# Patient Record
Sex: Female | Born: 1980 | Hispanic: No | Marital: Single | Smoking: Never smoker
Health system: Southern US, Community
[De-identification: ages and names within clinical notes are randomized; demographics above are authoritative.]

## PROBLEM LIST (undated history)

## (undated) HISTORY — PX: OTHER SURGICAL HISTORY: SHX169

## (undated) HISTORY — PX: CHOLECYSTECTOMY: SHX55

---

## 2002-07-02 ENCOUNTER — Encounter: Payer: Self-pay | Admitting: Emergency Medicine

## 2002-07-02 ENCOUNTER — Inpatient Hospital Stay (HOSPITAL_COMMUNITY): Admission: AC | Admit: 2002-07-02 | Discharge: 2002-07-05 | Payer: Self-pay

## 2012-07-11 ENCOUNTER — Emergency Department (HOSPITAL_COMMUNITY): Payer: Self-pay

## 2012-07-11 ENCOUNTER — Encounter (HOSPITAL_COMMUNITY): Payer: Self-pay | Admitting: Emergency Medicine

## 2012-07-11 ENCOUNTER — Emergency Department (HOSPITAL_COMMUNITY)
Admission: EM | Admit: 2012-07-11 | Discharge: 2012-07-11 | Disposition: A | Discharge status: Home / Self Care | Payer: Self-pay | Attending: Emergency Medicine | Admitting: Emergency Medicine

## 2012-07-11 DIAGNOSIS — N200 Calculus of kidney: Secondary | ICD-10-CM | POA: Insufficient documentation

## 2012-07-11 DIAGNOSIS — Z3202 Encounter for pregnancy test, result negative: Secondary | ICD-10-CM | POA: Insufficient documentation

## 2012-07-11 DIAGNOSIS — N390 Urinary tract infection, site not specified: Secondary | ICD-10-CM | POA: Insufficient documentation

## 2012-07-11 DIAGNOSIS — R110 Nausea: Secondary | ICD-10-CM | POA: Insufficient documentation

## 2012-07-11 DIAGNOSIS — N926 Irregular menstruation, unspecified: Secondary | ICD-10-CM | POA: Insufficient documentation

## 2012-07-11 DIAGNOSIS — Z8719 Personal history of other diseases of the digestive system: Secondary | ICD-10-CM | POA: Insufficient documentation

## 2012-07-11 LAB — COMPREHENSIVE METABOLIC PANEL
Alkaline Phosphatase: 78 U/L (ref 39–117)
BUN: 10 mg/dL (ref 6–23)
CO2: 27 mEq/L (ref 19–32)
Chloride: 102 mEq/L (ref 96–112)
Creatinine, Ser: 0.66 mg/dL (ref 0.50–1.10)
GFR calc Af Amer: >90 mL/min (ref >90)
GFR calc non Af Amer: >90 mL/min (ref >90)
Glucose, Bld: 110 mg/dL — ABNORMAL HIGH (ref 70–99)
Potassium: 3.8 mEq/L (ref 3.5–5.1)
Total Bilirubin: 0.4 mg/dL (ref 0.3–1.2)

## 2012-07-11 LAB — URINE MICROSCOPIC-ADD ON

## 2012-07-11 LAB — CBC WITH DIFFERENTIAL/PLATELET
Basophils Relative: 0 % (ref 0–1)
HCT: 41.2 % (ref 36.0–46.0)
Hemoglobin: 13.6 g/dL (ref 12.0–15.0)
Lymphs Abs: 2 10*3/uL (ref 0.7–4.0)
MCH: 28 pg (ref 26.0–34.0)
MCHC: 33 g/dL (ref 30.0–36.0)
Monocytes Absolute: 0.6 10*3/uL (ref 0.1–1.0)
Monocytes Relative: 5 % (ref 3–12)
Neutro Abs: 9.2 10*3/uL — ABNORMAL HIGH (ref 1.7–7.7)
Neutrophils Relative %: 78 % — ABNORMAL HIGH (ref 43–77)
RBC: 4.85 MIL/uL (ref 3.87–5.11)

## 2012-07-11 LAB — URINALYSIS, ROUTINE W REFLEX MICROSCOPIC
Bilirubin Urine: NEGATIVE
Ketones, ur: NEGATIVE mg/dL
Nitrite: NEGATIVE
Protein, ur: NEGATIVE mg/dL
Specific Gravity, Urine: 1.021 (ref 1.005–1.030)
Urobilinogen, UA: 0.2 mg/dL (ref 0.0–1.0)

## 2012-07-11 LAB — PREGNANCY, URINE: Preg Test, Ur: NEGATIVE

## 2012-07-11 LAB — LIPASE, BLOOD: Lipase: 15 U/L (ref 11–59)

## 2012-07-11 MED ORDER — SODIUM CHLORIDE 0.9 % IV BOLUS (SEPSIS)
1000.0000 mL | Freq: Once | INTRAVENOUS | Status: AC
  Administered 2012-07-11: 1000 mL via INTRAVENOUS

## 2012-07-11 MED ORDER — CEPHALEXIN 500 MG PO CAPS
500.0000 mg | ORAL_CAPSULE | Freq: Two times a day (BID) | ORAL | Status: DC
Start: 2012-07-11 — End: 2016-02-03

## 2012-07-11 MED ORDER — MORPHINE SULFATE 4 MG/ML IJ SOLN
4.0000 mg | Freq: Once | INTRAMUSCULAR | Status: AC
  Administered 2012-07-11: 4 mg via INTRAVENOUS
  Filled 2012-07-11: qty 1

## 2012-07-11 MED ORDER — ONDANSETRON HCL 4 MG/2ML IJ SOLN
4.0000 mg | Freq: Once | INTRAMUSCULAR | Status: AC
  Administered 2012-07-11: 4 mg via INTRAVENOUS
  Filled 2012-07-11: qty 2

## 2012-07-11 MED ORDER — IOHEXOL 300 MG/ML  SOLN
100.0000 mL | Freq: Once | INTRAMUSCULAR | Status: AC | PRN
  Administered 2012-07-11: 100 mL via INTRAVENOUS

## 2012-07-11 MED ORDER — DEXTROSE 5 % IV SOLN
1.0000 g | Freq: Once | INTRAVENOUS | Status: AC
  Administered 2012-07-11: 1 g via INTRAVENOUS
  Filled 2012-07-11: qty 10

## 2012-07-11 MED ORDER — OXYCODONE-ACETAMINOPHEN 5-325 MG PO TABS: 1.0000 | ORAL_TABLET | ORAL | Status: DC | PRN

## 2012-07-11 MED ORDER — SODIUM CHLORIDE 0.9 % IV BOLUS (SEPSIS): 1000.0000 mL | Freq: Once | INTRAVENOUS | Status: DC

## 2012-07-11 MED ORDER — NAPROXEN 375 MG PO TABS: 375.0000 mg | ORAL_TABLET | Freq: Two times a day (BID) | ORAL | Status: DC

## 2012-07-11 MED ORDER — KETOROLAC TROMETHAMINE 30 MG/ML IJ SOLN
30.0000 mg | Freq: Once | INTRAMUSCULAR | Status: AC
  Administered 2012-07-11: 30 mg via INTRAVENOUS
  Filled 2012-07-11: qty 1

## 2012-07-11 NOTE — ED Notes (Signed)
Pt states that she has had severe RUQ abd pain since 3 am this morning.  States nausea from the pain but denies V/D.  Pain 7/10.

## 2012-07-11 NOTE — ED Notes (Signed)
RN to obtain labs with start of IV 

## 2012-07-11 NOTE — ED Provider Notes (Signed)
History     CSN: [id number]  Arrival date & time 07/11/12  1036   None     Chief Complaint  Patient presents with  . Abdominal Pain    (Consider location/radiation/quality/duration/timing/severity/associated sxs/prior treatment) HPI Comments: Mary Mann is a 32 year old female with no past medical history who presented to the ED with RUQ & RLQ abdominal pain and associated nausea that started at three this morning and woke her up from her sleep. She describes the pain as stabbing and complains of worsening pain with movement.  She has not taken anything to relieve the pain.  Her pain is a 7 on a scale of 10.  She states when the pain is at its worst it radiates to her entire right side.  She denies any recent illness, vomiting, diarrhea, sob, urinary frequency, urgency or burning sensation on urination.  No recent alcohol use.  She has had acid reflux in the past but does not take any medications for it.  Her periods are irregular and she has not had a period in a year.  She is not taking birth control.  Her LNBM was yesterday.  The history is provided by the patient.    History reviewed. No pertinent past medical history.  History reviewed. No pertinent past surgical history.  History reviewed. No pertinent family history.  History  Substance Use Topics  . Smoking status: Never Smoker   . Smokeless tobacco: Not on file  . Alcohol Use: No    OB History    Grav Para Term Preterm Abortions TAB SAB Ect Mult Living                  Review of Systems  Constitutional: Positive for appetite change. Negative for fever, chills, diaphoresis and activity change.  HENT: Negative for congestion, neck pain, neck stiffness and dental problem.   Eyes: Negative for visual disturbance.  Respiratory: Negative for chest tightness, shortness of breath and wheezing.   Cardiovascular: Negative for chest pain.  Gastrointestinal: Positive for nausea and abdominal pain. Negative for vomiting,  diarrhea, constipation, blood in stool, abdominal distention, anal bleeding and rectal pain.  Genitourinary: Negative for dysuria, urgency, frequency, hematuria, flank pain, decreased urine volume, enuresis, difficulty urinating and pelvic pain.  Musculoskeletal: Negative for myalgias and arthralgias.  Skin: Negative for color change, rash and wound.  Neurological: Negative for dizziness, syncope, speech difficulty, numbness and headaches.  All other systems reviewed and are negative.    Allergies  Review of patient's allergies indicates no known allergies.  Home Medications  No current outpatient prescriptions on file.  BP 133/79  Pulse 49  Temp 98 F (36.7 C) (Oral)  Resp 18  SpO2 96%  LMP 07/12/2011  Physical Exam  Constitutional: She is oriented to person, place, and time. She appears well-developed and well-nourished. She appears distressed.  HENT:  Head: Normocephalic and atraumatic.  Cardiovascular: Normal rate, regular rhythm and intact distal pulses.   Pulmonary/Chest: Effort normal and breath sounds normal. No respiratory distress. She has no wheezes.  Abdominal: Soft. She exhibits no distension. There is tenderness. There is no rebound.       Right upper and lower quadrant tenderness worsening with palpation.  Positive Rovsings sign.  No rigidity or guarding.    Musculoskeletal: She exhibits no edema and no tenderness.  Neurological: She is alert and oriented to person, place, and time.  Skin: Skin is warm and dry. No rash noted. She is not diaphoretic. No erythema.  ED Course  Procedures (including critical care time)  Labs Reviewed  URINALYSIS, ROUTINE W REFLEX MICROSCOPIC - Abnormal; Notable for the following:    APPearance CLOUDY (*)     Hgb urine dipstick TRACE (*)     Leukocytes, UA LARGE (*)     All other components within normal limits  CBC WITH DIFFERENTIAL - Abnormal; Notable for the following:    WBC 11.8 (*)     Neutrophils Relative 78 (*)      Neutro Abs 9.2 (*)     All other components within normal limits  COMPREHENSIVE METABOLIC PANEL - Abnormal; Notable for the following:    Glucose, Bld 110 (*)     Albumin 3.4 (*)     All other components within normal limits  URINE MICROSCOPIC-ADD ON - Abnormal; Notable for the following:    Squamous Epithelial / LPF FEW (*)     Bacteria, UA MANY (*)     All other components within normal limits  LIPASE, BLOOD  PREGNANCY, URINE  URINE CULTURE   US Abdomen Complete  07/11/2012  *RADIOLOGY REPORT*  Clinical Data:  Right upper quadrant pain  ABDOMINAL ULTRASOUND COMPLETE  Comparison:  None.  Findings:  Gallbladder:  Echogenic shadowing gallstones noted.  Wall thickness measures were 5 mm.  No Murphy's sign or pericholecystic fluid.  No definite cholecystitis.  Common Bile Duct:  4.8 mm diameter.  No dilatation or obstruction.  Liver: No focal mass lesion identified.  Within normal limits in parenchymal echogenicity.  IVC:  Appears normal.  Pancreas:  No abnormality identified.  Spleen:  Within normal limits in size and echotexture.  Right kidney:  11.9 cm length.  Normal cortex and echogenicity. Mild central pelviectasis noted.  Incidental prominent medullary pyramids.  No focal lesion or mass.  Left kidney:  12.3 cm length.  Normal in size and echogenicity.  No hydronephrosis or acute finding.  Abdominal Aorta:  No aneurysm identified.  IMPRESSION: Cholelithiasis with no ultrasound evidence of cholecystitis.  No biliary dilatation  Mild right kidney pelviectasis, nonspecific   Original Report Authenticated By: Judie Petit. Miles Costain, M.D.    Ct Abdomen Pelvis W Contrast  07/11/2012  *RADIOLOGY REPORT*  Clinical Data: Right lower quadrant pain  CT ABDOMEN AND PELVIS WITH CONTRAST  Technique:  Multidetector CT imaging of the abdomen and pelvis was performed following the standard protocol during bolus administration of intravenous contrast.  Contrast: OMNIPAQUE IOHEXOL 300 MG/ML  SOLN  Comparison: None.   Findings: Lung bases:  Atelectasis is identified in the left lung base.  No pleural effusion.  Normal heart size.  No pericardial effusion.  Abdomen/pelvis:  Normal appearance of the liver.  Multiple stones within the gallbladder.  These measure up to 2.6 cm.  No gallbladder wall thickening or pericholecystic fluid.  No biliary dilatation.  The pancreas appears normal.  The spleen is normal.  Normal appearance of both adrenal glands.  There is mild edema of the right kidney.  Pelvocaliectasis and mild perinephric fluid is present.  There is a stone at the right UPJ which measures 4 mm, image 50.  Several small hypodensities within the upper pole of the right kidney, which are too small to characterize.  The urinary bladder appears normal.  The uterus is unremarkable.  There are multiple Nabothian cysts identified within the cervix.  The abdominal aorta has a normal caliber.  There is no upper abdominal adenopathy.  There is no pelvic or inguinal adenopathy identified.  No free fluid or fluid  collections within the abdomen or the pelvis.  The stomach is normal. The small bowel loops are unremarkable.  The appendix is visualized and appears normal.  Normal appearance of the colon.  Bones/Musculoskeletal:  No abnormalities identified.  IMPRESSION:  1.  Right UPJ calculus measures 4 mm.  This causes right-sided pelvocaliectasis and edema of the right kidney. 2.  Gallstones.   Original Report Authenticated By: Signa Kell, M.D.      No diagnosis found.    MDM  Kidney stone, UTI-afebrile  Pt has been diagnosed with a Kidney Stone via CT. There is no evidence of significant hydronephrosis, serum creatine WNL, vitals sign stable and the pt does not have irratractable vomiting. Pt will be dc home with pain medications, abx & has been advised to follow up with PCP. Strict return precautions discussed if fever develops as this would be a urologic emergency. Urine culture pending.          Jaci Carrel,  [place] 07/11/12 1430

## 2012-07-11 NOTE — ED Notes (Signed)
Ultra sound still in process.

## 2012-07-12 LAB — URINE CULTURE: Colony Count: 85000

## 2012-07-13 NOTE — ED Provider Notes (Signed)
Medical screening examination/treatment/procedure(s) were performed by non-physician practitioner and as supervising physician I was immediately available for consultation/collaboration.   Laithan Conchas M Chanah Tidmore, DO 07/13/12 1021 

## 2013-05-17 ENCOUNTER — Ambulatory Visit: Payer: Self-pay | Attending: Internal Medicine

## 2013-06-08 ENCOUNTER — Ambulatory Visit: Payer: Self-pay | Attending: Internal Medicine | Admitting: Internal Medicine

## 2013-06-08 ENCOUNTER — Encounter: Payer: Self-pay | Admitting: Internal Medicine

## 2013-06-08 VITALS — BP 107/74 | HR 63 | Temp 97.9°F | Resp 16 | Wt 215.0 lb

## 2013-06-08 DIAGNOSIS — E669 Obesity, unspecified: Secondary | ICD-10-CM | POA: Insufficient documentation

## 2013-06-08 DIAGNOSIS — Z139 Encounter for screening, unspecified: Secondary | ICD-10-CM

## 2013-06-08 DIAGNOSIS — N926 Irregular menstruation, unspecified: Secondary | ICD-10-CM

## 2013-06-08 LAB — COMPLETE METABOLIC PANEL WITH GFR
ALT: 29 U/L (ref 0–35)
AST: 22 U/L (ref 0–37)
Albumin: 3.8 g/dL (ref 3.5–5.2)
Alkaline Phosphatase: 79 U/L (ref 39–117)
BUN: 9 mg/dL (ref 6–23)
Creat: 0.55 mg/dL (ref 0.50–1.10)
Potassium: 4.1 mEq/L (ref 3.5–5.3)

## 2013-06-08 LAB — CBC WITH DIFFERENTIAL/PLATELET
Basophils Absolute: 0 10*3/uL (ref 0.0–0.1)
Basophils Relative: 0 % (ref 0–1)
HCT: 39.5 % (ref 36.0–46.0)
MCHC: 34.7 g/dL (ref 30.0–36.0)
Monocytes Absolute: 0.5 10*3/uL (ref 0.1–1.0)
Neutro Abs: 4.4 10*3/uL (ref 1.7–7.7)
Platelets: 305 10*3/uL (ref 150–400)
RDW: 13.9 % (ref 11.5–15.5)
WBC: 7.5 10*3/uL (ref 4.0–10.5)

## 2013-06-08 LAB — LIPID PANEL
Cholesterol: 194 mg/dL (ref 0–200)
HDL: 43 mg/dL (ref >39)
Total CHOL/HDL Ratio: 4.5 Ratio

## 2013-06-08 NOTE — Progress Notes (Signed)
Patient here to establish care Takes no prescribed medications 
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
## 2013-06-09 ENCOUNTER — Telehealth: Payer: Self-pay | Admitting: Emergency Medicine

## 2013-06-09 LAB — TSH: TSH: 1.898 u[IU]/mL (ref 0.350–4.500)

## 2013-06-09 MED ORDER — VITAMIN D (ERGOCALCIFEROL) 1.25 MG (50000 UNIT) PO CAPS: 50000.0000 [IU] | ORAL_CAPSULE | ORAL | Status: DC

## 2013-06-09 NOTE — Telephone Encounter (Signed)
Message copied by Darlis Loan on Wed Jun 09, 2013 12:21 PM ------      Message from: Doris Cheadle      Created: Wed Jun 09, 2013  9:20 AM       Blood work reviewed, noticed low vitamin D, call patient advise to start ergocalciferol 50,000 units once a week for the duration of  12 weeks.       noticed elevated cholesterol, advise patient for low fat diet.       ------

## 2013-06-09 NOTE — Addendum Note (Signed)
Addended by: Nonnie Done D on: 06/09/2013 01:36 PM   Modules accepted: Orders

## 2013-06-09 NOTE — Telephone Encounter (Signed)
No answe. Left message for pt to return call

## 2013-06-09 NOTE — Telephone Encounter (Signed)
Unable to leave a message.

## 2013-06-30 ENCOUNTER — Other Ambulatory Visit: Payer: Self-pay

## 2013-06-30 MED ORDER — VITAMIN D (ERGOCALCIFEROL) 1.25 MG (50000 UNIT) PO CAPS: 50000.0000 [IU] | ORAL_CAPSULE | ORAL | Status: DC

## 2013-07-20 ENCOUNTER — Ambulatory Visit: Payer: Self-pay | Admitting: Internal Medicine

## 2013-07-22 ENCOUNTER — Ambulatory Visit (INDEPENDENT_AMBULATORY_CARE_PROVIDER_SITE_OTHER): Payer: Self-pay | Admitting: Obstetrics & Gynecology

## 2013-07-22 ENCOUNTER — Encounter: Payer: Self-pay | Admitting: Obstetrics & Gynecology

## 2013-07-22 VITALS — BP 134/88 | HR 56 | Temp 97.8°F | Ht 60.0 in | Wt 212.4 lb

## 2013-07-22 DIAGNOSIS — Z01419 Encounter for gynecological examination (general) (routine) without abnormal findings: Secondary | ICD-10-CM

## 2013-07-22 DIAGNOSIS — N912 Amenorrhea, unspecified: Secondary | ICD-10-CM

## 2013-07-22 DIAGNOSIS — N911 Secondary amenorrhea: Secondary | ICD-10-CM

## 2013-07-22 LAB — POCT PREGNANCY, URINE
Preg Test, Ur: NEGATIVE
Preg Test, Ur: NEGATIVE

## 2013-07-22 LAB — FOLLICLE STIMULATING HORMONE: FSH: 5.4 m[IU]/mL

## 2013-07-22 LAB — HEMOGLOBIN A1C
HEMOGLOBIN A1C: 5.5 % (ref <5.7)
Mean Plasma Glucose: 111 mg/dL (ref <117)

## 2013-07-22 LAB — TSH: TSH: 2.585 u[IU]/mL (ref 0.350–4.500)

## 2013-07-22 MED ORDER — MEDROXYPROGESTERONE ACETATE 10 MG PO TABS: 10.0000 mg | ORAL_TABLET | Freq: Every day | ORAL | Status: DC
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
## 2013-07-22 NOTE — Patient Instructions (Addendum)
Secondary Amenorrhea  Secondary amenorrhea is the stopping of menstrual flow for 3 6 months in a female who has previously had periods. There are many possible causes. Most of these causes are not serious. Usually, treating the underlying problem causing the loss of menses will return your periods to normal. CAUSES  Some common and uncommon causes of not menstruating include:  Malnutrition.  Low blood sugar (hypoglycemia).  Polycystic ovary disease.  Stress or fear.  Breastfeeding.  Hormone imbalance.  Ovarian failure.  Medicines.  Extreme obesity.  Cystic fibrosis.  Low body weight or drastic weight reduction from any cause.  Early menopause.  Removal of ovaries or uterus.  Contraceptives.  Illness.  Long-term (chronic) illnesses.  Cushing syndrome.  Thyroid problems.  Birth control pills, patches, or vaginal rings for birth control. RISK FACTORS You may be at greater risk of secondary amenorrhea if:  You have a family history of this condition.  You have an eating disorder.  You do athletic training. DIAGNOSIS  A diagnosis is made by your health care provider taking a medical history and doing a physical exam. This will include a pelvic exam to check for problems with your reproductive organs. Pregnancy must be ruled out. Often, numerous blood tests are done to measure different hormones in the body. Urine testing may be done. Specialized exams (ultrasound, CT scan, MRI, or hysteroscopy) may have to be done as well as measuring the body mass index (BMI). TREATMENT  Treatment depends on the cause of the amenorrhea. If an eating disorder is present, this can be treated with an adequate diet and therapy. Chronic illnesses may improve with treatment of the illness. Amenorrhea may be corrected with medicines, lifestyle changes, or surgery. If the amenorrhea cannot be corrected, it is sometimes possible to create a false menstruation with medicines. HOME CARE  INSTRUCTIONS  Maintain a healthy diet.  Manage weight problems.  Exercise regularly but not excessively.  Get adequate sleep.  Manage stress.  Be aware of changes in your menstrual cycle. Keep a record of when your periods occur. Note the date your period starts, how long it lasts, and any problems. SEEK MEDICAL CARE IF: Your symptoms do not get better with treatment. Document Released: 07/29/2006 Document Revised: 02/17/2013 Document Reviewed: 12/03/2012 [facility] Patient Information 2014 [place], [place]. Sndrome ovrico poliqustico (Polycystic Ovarian Syndrome) El sndrome ovrico poliqustico (PCOS) es un trastorno hormonal comn en mujeres en edad reproductiva. La mayora de las mujeres con este sndrome desarrolla pequeos quistes en sus ovarios. Esto puede ocasionar problemas en la menstruacin y dificultades para quedar embarazada. Tambin puede aumentar el riesgo de aborto espontneo. Si no se trata, el sndrome ovrico poliqustico puede acarrear graves problemas de [place], como diabetes y enfermedades del Programmer, multimedia. CAUSAS La causa de este sndrome no se conoce, pero podra haber un factor gentico. SIGNOS Y SNTOMAS   Perodos menstruales irregulares o ausentes.   Incapacidad para quedar embarazada (infertilidad) debido a la falta de ovulacin   Aumento del crecimiento del vello en el rostro, el trax, el [address], la espalda, los muslos o los dedos de los pies.   Acn, piel grasa o caspa.   Dolor plvico.   Ganancia de peso u obesidad, por lo general, sobrepeso localizado alrededor de la cintura.   Diabetes tipo 2.   Colesterol elevado.   Hipertensin arterial.   Calvicie femenina o cabello muy fino.   Manchas de piel gruesa marrn oscura o negra en el cuello, brazos, pechos o caderas.   Pequeos excesos  de piel suelta (plipos cutneos) en las axilas o en el pecho.   Ronquidos excesivos e interrupcin de la respiracin durante el sueo (apnea  del sueo).   Tonalidad grave de Actuaryla voz.   Diabetes gestacional durante el embarazo.  DIAGNSTICO  No hay una nica prueba para diagnosticar el sndrome ovrico poliqustico.   El profesional que lo asiste:   Education officer, environmentalealizar una historia clnica.   Realizar un examen plvico   Realizar una prueba de Pinsonultrasonido.   Controlar sus niveles de hormonas femeninas y Kenaimasculinas.   Medir la glucosa o los niveles de Production assistant, radioazcar en sangre.   Realizar otros anlisis de [place].   Si est produciendo demasiadas hormonas masculinas, el mdico se asegurar de que se debe al sndrome ovrico poliqustico. Durante el examen fsico, el mdico evaluar las zonas de aumento de vello. Permita que se Educational psychologistproduzca el crecimiento natural del vello American Electric Powerdurante los das previos a la visita.   Durante el examen plvico, los ovarios podrn agrandarse o hincharse debido al aumento del nmero de pequeos quistes. Esto puede observarse ms fcilmente mediante la utilizacin de ultrasonido vaginal para examinar los ovarios y las paredes del tero (endometrio) en busca de quistes. Las paredes del tero pueden engrosarse si no tiene un perodo menstrual regular.  TRATAMIENTO  Debido a que no hay cura para el sndrome ovrico poliqustico, debe controlarse para Physiological scientistevitar problemas. Los tratamientos se basan en los sntomas. Tambin se basa en su deseo de tener un beb o usar anticonceptivos.  El tratamiento puede incluir:   Hormona progesterona, para iniciar un periodo menstrual.   Pldoras anticonceptivas, para Orthoptistregularizar los perodos menstruales.   Medicamentos para estimular la ovulacin, si quiere quedar embarazada.   Medicamentos para Air traffic controllercontrolar la insulina.   Medicamentos para Scientist, physiologicalcontrolar la presin arterial.   Medicamentos y dietas para Chief Operating Officercontrolar los altos niveles de colesterol y triglicridos en la sangre.  Medicamentos para reducir el crecimiento excesivo de vello.  Ciruga, realizando pequeos cortes  en el ovario, para disminuir la produccin de hormona masculina. Esto se realiza a travs de un tubo largo que ilumina (laparoscopio) que se coloca en la pelvis a travs de una pequea incisin en la zona inferior del abdomen.  INSTRUCCIONES PARA EL CUIDADO EN EL HOGAR  Tome slo medicamentos de venta libre o recetados, segn las indicaciones del mdico.  Preste atencin a su alimentacin y a sus niveles de actividad fsica. Esto puede ayudar a Web designerreducir los efectos del sndrome ovrico poliqustico.  Controle su [place]peso.  Consuma alimentos bajos en carbohidratos y [address]os en contenido de [place]fibra.  Haga ejercicios regularmente. SOLICITE ATENCIN MDICA SI:  Los sntomas no mejoran con los United Parcelmedicamentos.  Aparecen nuevos sntomas. Document Released: 10/03/2008 Document Revised: 04/07/2013 [facility] Patient Information 2014 [place], [place].

## 2013-07-29 ENCOUNTER — Encounter: Payer: Self-pay | Admitting: Internal Medicine

## 2013-07-29 ENCOUNTER — Ambulatory Visit: Payer: Self-pay | Attending: Internal Medicine | Admitting: Internal Medicine

## 2013-07-29 VITALS — BP 126/89 | HR 78 | Temp 98.7°F | Resp 14 | Ht 61.0 in | Wt 214.0 lb

## 2013-07-29 DIAGNOSIS — J019 Acute sinusitis, unspecified: Secondary | ICD-10-CM | POA: Insufficient documentation

## 2013-07-29 DIAGNOSIS — N912 Amenorrhea, unspecified: Secondary | ICD-10-CM | POA: Insufficient documentation

## 2013-07-29 MED ORDER — GUAIFENESIN-CODEINE 100-10 MG/5ML PO SOLN: 5.0000 mL | Freq: Four times a day (QID) | ORAL | Status: DC | PRN

## 2013-07-29 MED ORDER — AZITHROMYCIN 500 MG PO TABS: 500.0000 mg | ORAL_TABLET | Freq: Every day | ORAL | Status: AC

## 2013-07-29 NOTE — Progress Notes (Signed)
Pt is here for a gynecology f/u. Complains of a slight cold. Taking Dayquil for cold.

## 2013-07-29 NOTE — Progress Notes (Signed)
Patient ID: Mary Mann, female   DOB: 1981/01/19, 33 y.o.   MRN: [id number]   CC:  HPI:  33 year old female here for routine followup. She has had sinus congestion, sore throat, low-grade fever at home. She denies any sick contacts Her recent history of travel. She has been taking dayquill to help her with her symptoms.   Allergies  Allergen Reactions  . Penicillins    No past medical history on file. Current Outpatient Prescriptions on File Prior to Visit  Medication Sig Dispense Refill  . cephALEXin (KEFLEX) 500 MG capsule Take 1 capsule (500 mg total) by mouth 2 (two) times daily.  14 capsule  0  . medroxyPROGESTERone (PROVERA) 10 MG tablet Take 1 tablet (10 mg total) by mouth daily. Use for ten days  10 tablet  6  . naproxen (NAPROSYN) 375 MG tablet Take 1 tablet (375 mg total) by mouth 2 (two) times daily.  20 tablet  0  . oxyCODONE-acetaminophen (ROXICET) 5-325 MG per tablet Take 1 tablet by mouth every 4 (four) hours as needed for pain.  15 tablet  0  . vitamin C (ASCORBIC ACID) 500 MG tablet Take 500 mg by mouth daily.      . Vitamin D, Ergocalciferol, (DRISDOL) 50000 UNITS CAPS capsule Take 1 capsule (50,000 Units total) by mouth every 7 (seven) days.  12 capsule  0   No current facility-administered medications on file prior to visit.   Family History  Problem Relation Age of Onset  . Heart disease Paternal Grandfather   . Thyroid disease Maternal Aunt    History   Social History  . Marital Status: Single    Spouse Name: N/A    Number of Children: N/A  . Years of Education: N/A   Occupational History  . Not on file.   Social History Main Topics  . Smoking status: Never Smoker   . Smokeless tobacco: Not on file  . Alcohol Use: No  . Drug Use: No  . Sexual Activity: Not on file   Other Topics Concern  . Not on file   Social History Narrative  . No narrative on file    Review of Systems  Constitutional: Negative for fever, chills, diaphoresis,  activity change, appetite change and fatigue.  HENT: Negative for ear pain, nosebleeds, congestion, facial swelling, rhinorrhea, neck pain, neck stiffness and ear discharge.   Eyes: Negative for pain, discharge, redness, itching and visual disturbance.  Respiratory: Negative for cough, choking, chest tightness, shortness of breath, wheezing and stridor.   Cardiovascular: Negative for chest pain, palpitations and leg swelling.  Gastrointestinal: Negative for abdominal distention.  Genitourinary: Negative for dysuria, urgency, frequency, hematuria, flank pain, decreased urine volume, difficulty urinating and dyspareunia.  Musculoskeletal: Negative for back pain, joint swelling, arthralgias and gait problem.  Neurological: Negative for dizziness, tremors, seizures, syncope, facial asymmetry, speech difficulty, weakness, light-headedness, numbness and headaches.  Hematological: Negative for adenopathy. Does not bruise/bleed easily.  Psychiatric/Behavioral: Negative for hallucinations, behavioral problems, confusion, dysphoric mood, decreased concentration and agitation.    Objective:   Filed Vitals:   07/29/13 1439  BP: 126/89  Pulse: 78  Temp: 98.7 F (37.1 C)  Resp: 14    Physical Exam  Constitutional: Appears well-developed and well-nourished. No distress.  HENT: Normocephalic. External right and left ear normal. Oropharynx is clear and moist.  Eyes: Conjunctivae and EOM are normal. PERRLA, no scleral icterus.  Neck: Normal ROM. Neck supple. No JVD. No tracheal deviation. No thyromegaly.  CVS: RRR, S1/S2 +,  no murmurs, no gallops, no carotid bruit.  Pulmonary: Effort and breath sounds normal, no stridor, rhonchi, wheezes, rales.  Abdominal: Soft. BS +,  no distension, tenderness, rebound or guarding.  Musculoskeletal: Normal range of motion. No edema and no tenderness.  Lymphadenopathy: No lymphadenopathy noted, cervical, inguinal. Neuro: Alert. Normal reflexes, muscle tone  coordination. No cranial nerve deficit. Skin: Skin is warm and dry. No rash noted. Not diaphoretic. No erythema. No pallor.  Psychiatric: Normal mood and affect. Behavior, judgment, thought content normal.   Lab Results  Component Value Date   WBC 7.5 06/08/2013   HGB 13.7 06/08/2013   HCT 39.5 06/08/2013   MCV 79.6 06/08/2013   PLT 305 06/08/2013   Lab Results  Component Value Date   CREATININE 0.55 06/08/2013   BUN 9 06/08/2013   NA 138 06/08/2013   K 4.1 06/08/2013   CL 103 06/08/2013   CO2 30 06/08/2013    Lab Results  Component Value Date   HGBA1C 5.5 07/22/2013   Lipid Panel     Component Value Date/Time   CHOL 194 06/08/2013 0936   TRIG 172* 06/08/2013 0936   HDL 43 06/08/2013 0936   CHOLHDL 4.5 06/08/2013 0936   VLDL 34 06/08/2013 0936   LDLCALC 117* 06/08/2013 0936       Assessment and plan:   Patient Active Problem List   Diagnosis Date Noted  . Irregular periods 06/08/2013  . Obesity, unspecified 06/08/2013   Acute sinusitis/laryngitis Azithromycin for 7 days Robitussin with codeine for cough Patient call sooner if no improvement next    Amenorrhea Now on Provera, seen by gynecologist No further appointments needed for this problem  Follow up in 2 months      The patient was given clear instructions to go to ER or return to medical center if symptoms don't improve, worsen or new problems develop. The patient verbalized understanding. The patient was told to call to get any lab results if not heard anything in the next week.

## 2013-10-28 ENCOUNTER — Ambulatory Visit: Payer: Self-pay | Admitting: Internal Medicine

## 2014-04-05 ENCOUNTER — Ambulatory Visit: Payer: Self-pay | Attending: Internal Medicine

## 2014-06-30 ENCOUNTER — Emergency Department (INDEPENDENT_AMBULATORY_CARE_PROVIDER_SITE_OTHER)
Admission: EM | Admit: 2014-06-30 | Discharge: 2014-06-30 | Disposition: A | Discharge status: Home / Self Care | Payer: Self-pay | Source: Home / Self Care | Attending: Emergency Medicine | Admitting: Emergency Medicine

## 2014-06-30 ENCOUNTER — Encounter (HOSPITAL_COMMUNITY): Payer: Self-pay | Admitting: *Deleted

## 2014-06-30 DIAGNOSIS — J018 Other acute sinusitis: Secondary | ICD-10-CM

## 2014-06-30 DIAGNOSIS — H9201 Otalgia, right ear: Secondary | ICD-10-CM

## 2014-06-30 MED ORDER — IBUPROFEN 800 MG PO TABS: 800.0000 mg | ORAL_TABLET | Freq: Three times a day (TID) | ORAL | Status: DC | PRN

## 2014-06-30 MED ORDER — AZITHROMYCIN 250 MG PO TABS: ORAL_TABLET | ORAL | Status: DC

## 2014-06-30 NOTE — Discharge Instructions (Signed)
Otalgia °The most common reason for this in children is an infection of the middle ear. Pain from the middle ear is usually caused by a build-up of fluid and pressure behind the eardrum. Pain from an earache can be sharp, dull, or burning. The pain may be temporary or constant. The middle ear is connected to the nasal passages by a short narrow tube called the Eustachian tube. The Eustachian tube allows fluid to drain out of the middle ear, and helps keep the pressure in your ear equalized. °CAUSES  °A cold or allergy can block the Eustachian tube with inflammation and the build-up of secretions. This is especially likely in small children, because their Eustachian tube is shorter and more horizontal. When the Eustachian tube closes, the normal flow of fluid from the middle ear is stopped. Fluid can accumulate and cause stuffiness, pain, hearing loss, and an ear infection if germs start growing in this area. °SYMPTOMS  °The symptoms of an ear infection may include fever, ear pain, fussiness, increased crying, and irritability. Many children will have temporary and minor hearing loss during and right after an ear infection. Permanent hearing loss is rare, but the risk increases the more infections a child has. Other causes of ear pain include retained water in the outer ear canal from swimming and bathing. °Ear pain in adults is less likely to be from an ear infection. Ear pain may be referred from other locations. Referred pain may be from the joint between your jaw and the skull. It may also come from a tooth problem or problems in the neck. Other causes of ear pain include: °· A foreign body in the ear. °· Outer ear infection. °· Sinus infections. °· Impacted ear wax. °· Ear injury. °· Arthritis of the jaw or TMJ problems. °· Middle ear infection. °· Tooth infections. °· Sore throat with pain to the ears. °DIAGNOSIS  °Your caregiver can usually make the diagnosis by examining you. Sometimes other special studies,  including x-rays and lab work may be necessary. °TREATMENT  °· If antibiotics were prescribed, use them as directed and finish them even if you or your child's symptoms seem to be improved. °· Sometimes PE tubes are needed in children. These are little plastic tubes which are put into the eardrum during a simple surgical procedure. They allow fluid to drain easier and allow the pressure in the middle ear to equalize. This helps relieve the ear pain caused by pressure changes. °HOME CARE INSTRUCTIONS  °· Only take over-the-counter or prescription medicines for pain, discomfort, or fever as directed by your caregiver. DO NOT GIVE CHILDREN ASPIRIN because of the association of Reye's Syndrome in children taking aspirin. °· Use a cold pack applied to the outer ear for 15-20 minutes, 03-04 times per day or as needed may reduce pain. Do not apply ice directly to the skin. You may cause frost bite. °· Over-the-counter ear drops used as directed may be effective. Your caregiver may sometimes prescribe ear drops. °· Resting in an upright position may help reduce pressure in the middle ear and relieve pain. °· Ear pain caused by rapidly descending from high altitudes can be relieved by swallowing or chewing gum. Allowing infants to suck on a bottle during airplane travel can help. °· Do not smoke in the house or near children. If you are unable to quit smoking, smoke outside. °· Control allergies. °SEEK IMMEDIATE MEDICAL CARE IF:  °· You or your child are becoming sicker. °· Pain or fever   relief is not obtained with medicine.  You or your child's symptoms (pain, fever, or irritability) do not improve within 24 to 48 hours or as instructed.  Severe pain suddenly stops hurting. This may indicate a ruptured eardrum.  You or your children develop new problems such as severe headaches, stiff neck, difficulty swallowing, or swelling of the face or around the ear. Document Released: 02/02/2004 Document Revised: 09/09/2011  Document Reviewed: 06/08/2008 [facility] Patient Information 2015 [place], [place]. This information is not intended to replace advice given to you by your health care provider. Make sure you discuss any questions you have with your health care provider.  Sinusitis Sinusitis is redness, soreness, and inflammation of the paranasal sinuses. Paranasal sinuses are air pockets within the bones of your face (beneath the eyes, the middle of the forehead, or above the eyes). In healthy paranasal sinuses, mucus is able to drain out, and air is able to circulate through them by way of your nose. However, when your paranasal sinuses are inflamed, mucus and air can become trapped. This can allow bacteria and other germs to grow and cause infection. Sinusitis can develop quickly and last only a short time (acute) or continue over a long period (chronic). Sinusitis that lasts for more than 12 weeks is considered chronic.  CAUSES  Causes of sinusitis include:  Allergies.  Structural abnormalities, such as displacement of the cartilage that separates your nostrils (deviated septum), which can decrease the air flow through your nose and sinuses and affect sinus drainage.  Functional abnormalities, such as when the small hairs (cilia) that line your sinuses and help remove mucus do not work properly or are not present. SIGNS AND SYMPTOMS  Symptoms of acute and chronic sinusitis are the same. The primary symptoms are pain and pressure around the affected sinuses. Other symptoms include:  Upper toothache.  Earache.  Headache.  Bad breath.  Decreased sense of smell and taste.  A cough, which worsens when you are lying flat.  Fatigue.  Fever.  Thick drainage from your nose, which often is green and may contain pus (purulent).  Swelling and warmth over the affected sinuses. DIAGNOSIS  Your health care provider will perform a physical exam. During the exam, your health care provider may:  Look in your  nose for signs of abnormal growths in your nostrils (nasal polyps).  Tap over the affected sinus to check for signs of infection.  View the inside of your sinuses (endoscopy) using an imaging device that has a light attached (endoscope). If your health care provider suspects that you have chronic sinusitis, one or more of the following tests may be recommended:  Allergy tests.  Nasal culture. A sample of mucus is taken from your nose, sent to a lab, and screened for bacteria.  Nasal cytology. A sample of mucus is taken from your nose and examined by your health care provider to determine if your sinusitis is related to an allergy. TREATMENT  Most cases of acute sinusitis are related to a viral infection and will resolve on their own within 10 days. Sometimes medicines are prescribed to help relieve symptoms (pain medicine, decongestants, nasal steroid sprays, or saline sprays).  However, for sinusitis related to a bacterial infection, your health care provider will prescribe antibiotic medicines. These are medicines that will help kill the bacteria causing the infection.  Rarely, sinusitis is caused by a fungal infection. In theses cases, your health care provider will prescribe antifungal medicine. For some cases of chronic sinusitis, surgery  is needed. Generally, these are cases in which sinusitis recurs more than 3 times per year, despite other treatments. HOME CARE INSTRUCTIONS   Drink plenty of water. Water helps thin the mucus so your sinuses can drain more easily.  Use a humidifier.  Inhale steam 3 to 4 times a day (for example, sit in the bathroom with the shower running).  Apply a warm, moist washcloth to your face 3 to 4 times a day, or as directed by your health care provider.  Use saline nasal sprays to help moisten and clean your sinuses.  Take medicines only as directed by your health care provider.  If you were prescribed either an antibiotic or antifungal medicine,  finish it all even if you start to feel better. SEEK IMMEDIATE MEDICAL CARE IF:  You have increasing pain or severe headaches.  You have nausea, vomiting, or drowsiness.  You have swelling around your face.  You have vision problems.  You have a stiff neck.  You have difficulty breathing. MAKE SURE YOU:   Understand these instructions.  Will watch your condition.  Will get help right away if you are not doing well or get worse. Document Released: 06/17/2005 Document Revised: 11/01/2013 Document Reviewed: 07/02/2011 [facility] Patient Information 2015 [place], [place]. This information is not intended to replace advice given to you by your health care provider. Make sure you discuss any questions you have with your health care provider.
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
## 2014-06-30 NOTE — ED Notes (Signed)
Pt   Reports     r   Earache      With       Fever       Developed yesterday            Reports  Fever  Last  Pm             at t6his  Time  She  Is sitting  Upright on  Exam table  Speaking in  Complete  sentances  And  Is  In no  Acute  Distress

## 2014-10-02 ENCOUNTER — Encounter (HOSPITAL_COMMUNITY): Payer: Self-pay | Admitting: Emergency Medicine

## 2014-10-02 ENCOUNTER — Emergency Department (HOSPITAL_COMMUNITY)
Admission: EM | Admit: 2014-10-02 | Discharge: 2014-10-02 | Disposition: A | Discharge status: Home / Self Care | Payer: Self-pay | Attending: Emergency Medicine | Admitting: Emergency Medicine

## 2014-10-02 DIAGNOSIS — J020 Streptococcal pharyngitis: Secondary | ICD-10-CM

## 2014-10-02 DIAGNOSIS — Z880 Allergy status to penicillin: Secondary | ICD-10-CM | POA: Insufficient documentation

## 2014-10-02 DIAGNOSIS — Z791 Long term (current) use of non-steroidal anti-inflammatories (NSAID): Secondary | ICD-10-CM | POA: Insufficient documentation

## 2014-10-02 DIAGNOSIS — Z792 Long term (current) use of antibiotics: Secondary | ICD-10-CM | POA: Insufficient documentation

## 2014-10-02 DIAGNOSIS — J36 Peritonsillar abscess: Secondary | ICD-10-CM | POA: Insufficient documentation

## 2014-10-02 DIAGNOSIS — H9201 Otalgia, right ear: Secondary | ICD-10-CM | POA: Insufficient documentation

## 2014-10-02 DIAGNOSIS — Z79899 Other long term (current) drug therapy: Secondary | ICD-10-CM | POA: Insufficient documentation

## 2014-10-02 LAB — RAPID STREP SCREEN (MED CTR MEBANE ONLY): Streptococcus, Group A Screen (Direct): POSITIVE — AB

## 2014-10-02 MED ORDER — HYDROCODONE-ACETAMINOPHEN 7.5-325 MG/15ML PO SOLN
15.0000 mL | Freq: Four times a day (QID) | ORAL | Status: AC | PRN
Start: 2014-10-02 — End: 2015-10-02

## 2014-10-02 MED ORDER — IBUPROFEN 600 MG PO TABS: 600.0000 mg | ORAL_TABLET | Freq: Four times a day (QID) | ORAL | Status: DC | PRN

## 2014-10-02 MED ORDER — AMOXICILLIN 500 MG PO CAPS
1000.0000 mg | ORAL_CAPSULE | Freq: Once | ORAL | Status: AC
  Administered 2014-10-02: 1000 mg via ORAL
  Filled 2014-10-02: qty 2

## 2014-10-02 MED ORDER — AMOXICILLIN 500 MG PO CAPS: ORAL_CAPSULE | ORAL | Status: DC
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
## 2014-10-02 NOTE — ED Notes (Signed)
Declined W/C at D/C and was escorted to lobby by RN. 

## 2014-10-02 NOTE — ED Notes (Signed)
Pt c/o sore throat and right ear pain x 2 weeks. Pt with swelling ton tonsil area.

## 2014-10-10 ENCOUNTER — Ambulatory Visit: Payer: Self-pay | Admitting: Internal Medicine

## 2016-02-03 ENCOUNTER — Encounter (HOSPITAL_COMMUNITY): Payer: Self-pay | Admitting: *Deleted

## 2016-02-03 ENCOUNTER — Inpatient Hospital Stay (HOSPITAL_COMMUNITY)
Admission: EM | Admit: 2016-02-03 | Discharge: 2016-02-06 | DRG: 418 | Disposition: A | Discharge status: Home / Self Care | Payer: Self-pay | Attending: General Surgery | Admitting: General Surgery

## 2016-02-03 ENCOUNTER — Emergency Department (HOSPITAL_COMMUNITY): Payer: Self-pay

## 2016-02-03 DIAGNOSIS — K8020 Calculus of gallbladder without cholecystitis without obstruction: Secondary | ICD-10-CM | POA: Diagnosis present

## 2016-02-03 DIAGNOSIS — R112 Nausea with vomiting, unspecified: Secondary | ICD-10-CM

## 2016-02-03 DIAGNOSIS — Z6841 Body Mass Index (BMI) 40.0 and over, adult: Secondary | ICD-10-CM

## 2016-02-03 DIAGNOSIS — Z880 Allergy status to penicillin: Secondary | ICD-10-CM

## 2016-02-03 DIAGNOSIS — K8043 Calculus of bile duct with acute cholecystitis with obstruction: Principal | ICD-10-CM | POA: Diagnosis present

## 2016-02-03 DIAGNOSIS — R1011 Right upper quadrant pain: Secondary | ICD-10-CM

## 2016-02-03 DIAGNOSIS — K8050 Calculus of bile duct without cholangitis or cholecystitis without obstruction: Secondary | ICD-10-CM

## 2016-02-03 LAB — COMPREHENSIVE METABOLIC PANEL
ALT: 115 U/L — ABNORMAL HIGH (ref 14–54)
AST: 97 U/L — AB (ref 15–41)
Albumin: 3.8 g/dL (ref 3.5–5.0)
Alkaline Phosphatase: 121 U/L (ref 38–126)
Anion gap: 9 (ref 5–15)
CO2: 30 mmol/L (ref 22–32)
CREATININE: 0.58 mg/dL (ref 0.44–1.00)
Calcium: 10.1 mg/dL (ref 8.9–10.3)
Chloride: 94 mmol/L — ABNORMAL LOW (ref 101–111)
GFR calc Af Amer: >60 mL/min (ref >60)
GLUCOSE: 146 mg/dL — AB (ref 65–99)
Potassium: 4.7 mmol/L (ref 3.5–5.1)
Sodium: 133 mmol/L — ABNORMAL LOW (ref 135–145)
TOTAL PROTEIN: 7.9 g/dL (ref 6.5–8.1)
Total Bilirubin: 0.9 mg/dL (ref 0.3–1.2)

## 2016-02-03 LAB — SURGICAL PCR SCREEN
MRSA, PCR: NEGATIVE
Staphylococcus aureus: POSITIVE — AB

## 2016-02-03 LAB — CBC
HCT: 43.4 % (ref 36.0–46.0)
Hemoglobin: 14.5 g/dL (ref 12.0–15.0)
MCH: 28.3 pg (ref 26.0–34.0)
MCHC: 33.4 g/dL (ref 30.0–36.0)
MCV: 84.8 fL (ref 78.0–100.0)
PLATELETS: 335 10*3/uL (ref 150–400)
RBC: 5.12 MIL/uL — ABNORMAL HIGH (ref 3.87–5.11)
RDW: 13 % (ref 11.5–15.5)
WBC: 16.5 10*3/uL — AB (ref 4.0–10.5)

## 2016-02-03 LAB — URINALYSIS, ROUTINE W REFLEX MICROSCOPIC
BILIRUBIN URINE: NEGATIVE
Glucose, UA: 100 mg/dL — AB
Hgb urine dipstick: NEGATIVE
KETONES UR: 15 mg/dL — AB
Leukocytes, UA: NEGATIVE
NITRITE: NEGATIVE
PROTEIN: NEGATIVE mg/dL
Specific Gravity, Urine: 1.013 (ref 1.005–1.030)
pH: 7 (ref 5.0–8.0)

## 2016-02-03 LAB — POC URINE PREG, ED: PREG TEST UR: NEGATIVE

## 2016-02-03 LAB — LIPASE, BLOOD: Lipase: 15 U/L (ref 11–51)

## 2016-02-03 MED ORDER — HYDROMORPHONE HCL 1 MG/ML IJ SOLN
0.5000 mg | Freq: Once | INTRAMUSCULAR | Status: AC
  Administered 2016-02-03: 0.5 mg via INTRAVENOUS
  Filled 2016-02-03: qty 1

## 2016-02-03 MED ORDER — ONDANSETRON HCL 4 MG/2ML IJ SOLN
4.0000 mg | Freq: Once | INTRAMUSCULAR | Status: AC
  Administered 2016-02-03: 4 mg via INTRAVENOUS
  Filled 2016-02-03: qty 2

## 2016-02-03 MED ORDER — ENOXAPARIN SODIUM 40 MG/0.4ML ~~LOC~~ SOLN
40.0000 mg | SUBCUTANEOUS | Status: DC
  Administered 2016-02-04: 40 mg via SUBCUTANEOUS
  Filled 2016-02-03: qty 0.4

## 2016-02-03 MED ORDER — FAMOTIDINE IN NACL 20-0.9 MG/50ML-% IV SOLN
20.0000 mg | Freq: Two times a day (BID) | INTRAVENOUS | Status: DC
  Administered 2016-02-03 – 2016-02-05 (×5): 20 mg via INTRAVENOUS
  Filled 2016-02-03 (×8): qty 50

## 2016-02-03 MED ORDER — METRONIDAZOLE IN NACL 5-0.79 MG/ML-% IV SOLN
500.0000 mg | Freq: Three times a day (TID) | INTRAVENOUS | Status: DC
  Administered 2016-02-03 – 2016-02-05 (×6): 500 mg via INTRAVENOUS
  Filled 2016-02-03 (×8): qty 100

## 2016-02-03 MED ORDER — KCL IN DEXTROSE-NACL 20-5-0.45 MEQ/L-%-% IV SOLN
INTRAVENOUS | Status: DC
  Administered 2016-02-03 – 2016-02-05 (×2): via INTRAVENOUS
  Filled 2016-02-03 (×3): qty 1000

## 2016-02-03 MED ORDER — HYDROMORPHONE HCL 1 MG/ML IJ SOLN
0.5000 mg | Freq: Once | INTRAMUSCULAR | Status: AC
Start: 2016-02-03 — End: 2016-02-03
  Administered 2016-02-03: 0.5 mg via INTRAVENOUS
  Filled 2016-02-03: qty 1

## 2016-02-03 MED ORDER — HYDROMORPHONE HCL 1 MG/ML IJ SOLN
1.0000 mg | INTRAMUSCULAR | Status: DC | PRN
  Administered 2016-02-03 – 2016-02-04 (×5): 2 mg via INTRAVENOUS
  Filled 2016-02-03 (×6): qty 2

## 2016-02-03 MED ORDER — ONDANSETRON 4 MG PO TBDP
4.0000 mg | ORAL_TABLET | Freq: Four times a day (QID) | ORAL | Status: DC | PRN
  Filled 2016-02-03: qty 1

## 2016-02-03 MED ORDER — SODIUM CHLORIDE 0.9 % IV SOLN
8.0000 mg | Freq: Once | INTRAVENOUS | Status: AC
  Administered 2016-02-03: 8 mg via INTRAVENOUS
  Filled 2016-02-03: qty 4

## 2016-02-03 MED ORDER — MUPIROCIN 2 % EX OINT
1.0000 "application " | TOPICAL_OINTMENT | Freq: Two times a day (BID) | CUTANEOUS | Status: DC
  Administered 2016-02-04 – 2016-02-06 (×5): 1 via NASAL
  Filled 2016-02-03: qty 22

## 2016-02-03 MED ORDER — MORPHINE SULFATE (PF) 4 MG/ML IV SOLN
4.0000 mg | Freq: Once | INTRAVENOUS | Status: AC
  Administered 2016-02-03: 4 mg via INTRAVENOUS
  Filled 2016-02-03: qty 1

## 2016-02-03 MED ORDER — CHLORHEXIDINE GLUCONATE CLOTH 2 % EX PADS
6.0000 | MEDICATED_PAD | Freq: Every day | CUTANEOUS | Status: DC
  Administered 2016-02-05 – 2016-02-06 (×2): 6 via TOPICAL

## 2016-02-03 MED ORDER — ONDANSETRON HCL 4 MG/2ML IJ SOLN: 4.0000 mg | Freq: Four times a day (QID) | INTRAMUSCULAR | Status: DC | PRN

## 2016-02-03 MED ORDER — SODIUM CHLORIDE 0.9 % IV SOLN
Freq: Once | INTRAVENOUS | Status: AC
  Administered 2016-02-03: 1000 mL via INTRAVENOUS

## 2016-02-03 MED ORDER — CIPROFLOXACIN IN D5W 400 MG/200ML IV SOLN
400.0000 mg | Freq: Two times a day (BID) | INTRAVENOUS | Status: DC
  Administered 2016-02-03 – 2016-02-05 (×4): 400 mg via INTRAVENOUS
  Filled 2016-02-03 (×5): qty 200

## 2016-02-03 NOTE — ED Notes (Signed)
PA at bedside.

## 2016-02-03 NOTE — ED Provider Notes (Signed)
  PROGRESS NOTE                                                                                                                 This is a sign-out from PA Muthersbaugh at shift change: Mary Mann is a 35 y.o. female presenting with postprandial right upper quadrant abdominal pain over the last several months significantly worsening recently, abdominal exam is nonsurgical, patient reports tactile fevers at home, she has a white count of 16 here. Afebrile overall nontoxic appearing, plan is to follow-up for sound and control pain.  Please refer to previous note for full HPI, ROS, PMH and PE.   Patient seen and evaluated the bedside, she rates her pain at 7 out of 10 this is after she's had her morphine, abdominal exam nonsurgical but she has significant right upper quadrant tenderness, positive Murphy sign. Normoactive bowel sounds.  Ultrasound with gallstones, no wall thickening or pericholecystic fluid. Given this patient's symptomatic gallstones her white count of 16 the mild bump in her LFTs will discuss with surgery.  9:50 AM: Patient seen and evaluated at the bedside, we discussed the gallstones and no signs of any cholecystitis or choledocholithiasis, given the right-sided nature of her pain in the complex cyst is noted on the ultrasound will obtain CT stone protocol before discussing symptomatically her colic with general surgery.  CT shows a moderately distended gallbladder with multiple large gallstones and a small amount of pericholecystic fluid. They also noted very mild retroperitoneal fat stranding along the celiac plexus however, this patient has a normal lipase. Right kidney with a 1.7 cm cystic lesion  General surgery consult from Dr. Lindie Spruce appreciated: He will admit the patient.  Vitals:   02/03/16 0550 02/03/16 0600 02/03/16 0700 02/03/16 0816  BP: 145/81 139/86 132/86 147/94  Pulse: (!) 56 (!) 48 83 65  Resp:    18  Temp:    97.7 F (36.5 C)  TempSrc:    Oral  SpO2: 100%  98% 98% 99%  Weight:      Height:        Medications  0.9 %  sodium chloride infusion (1,000 mLs Intravenous New Bag/Given 02/03/16 0604)  morphine 4 MG/ML injection 4 mg (4 mg Intravenous Given 02/03/16 0604)  ondansetron (ZOFRAN) injection 4 mg (4 mg Intravenous Given 02/03/16 0604)  HYDROmorphone (DILAUDID) injection 0.5 mg (0.5 mg Intravenous Given 02/03/16 0713)        Joni Reining Fermin Yan, PA-C 02/03/16 1202    Vanetta Mulders, MD 02/04/16 1400
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
## 2016-02-03 NOTE — ED Notes (Signed)
Patient transported to X-ray 

## 2016-02-03 NOTE — ED Notes (Signed)
General surgery at bedside. 

## 2016-02-03 NOTE — ED Triage Notes (Signed)
Patient presents stating for 3 weeks she has been having dysuria but has gotten worse.  Denies vaginal discharge
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
## 2016-02-04 ENCOUNTER — Observation Stay (HOSPITAL_COMMUNITY): Payer: Self-pay

## 2016-02-04 ENCOUNTER — Encounter (HOSPITAL_COMMUNITY): Admission: EM | Disposition: A | Discharge status: Home / Self Care | Payer: Self-pay | Source: Home / Self Care

## 2016-02-04 ENCOUNTER — Encounter (HOSPITAL_COMMUNITY): Payer: Self-pay | Admitting: Certified Registered Nurse Anesthetist

## 2016-02-04 ENCOUNTER — Observation Stay (HOSPITAL_COMMUNITY): Payer: Self-pay | Admitting: Certified Registered Nurse Anesthetist

## 2016-02-04 DIAGNOSIS — K8020 Calculus of gallbladder without cholecystitis without obstruction: Secondary | ICD-10-CM

## 2016-02-04 DIAGNOSIS — K8050 Calculus of bile duct without cholangitis or cholecystitis without obstruction: Secondary | ICD-10-CM

## 2016-02-04 HISTORY — PX: CHOLECYSTECTOMY: SHX55

## 2016-02-04 LAB — CBC
HCT: 39.3 % (ref 36.0–46.0)
Hemoglobin: 12.4 g/dL (ref 12.0–15.0)
MCH: 27.2 pg (ref 26.0–34.0)
MCHC: 31.6 g/dL (ref 30.0–36.0)
MCV: 86.2 fL (ref 78.0–100.0)
PLATELETS: 265 10*3/uL (ref 150–400)
RBC: 4.56 MIL/uL (ref 3.87–5.11)
RDW: 13.6 % (ref 11.5–15.5)
WBC: 9.1 10*3/uL (ref 4.0–10.5)

## 2016-02-04 LAB — COMPREHENSIVE METABOLIC PANEL
ALK PHOS: 134 U/L — AB (ref 38–126)
ALT: 461 U/L — AB (ref 14–54)
AST: 373 U/L — AB (ref 15–41)
Albumin: 2.8 g/dL — ABNORMAL LOW (ref 3.5–5.0)
Anion gap: 4 — ABNORMAL LOW (ref 5–15)
BILIRUBIN TOTAL: 2.7 mg/dL — AB (ref 0.3–1.2)
CALCIUM: 8.2 mg/dL — AB (ref 8.9–10.3)
CHLORIDE: 99 mmol/L — AB (ref 101–111)
CO2: 32 mmol/L (ref 22–32)
CREATININE: 0.55 mg/dL (ref 0.44–1.00)
Glucose, Bld: 140 mg/dL — ABNORMAL HIGH (ref 65–99)
Potassium: 4 mmol/L (ref 3.5–5.1)
Sodium: 135 mmol/L (ref 135–145)
Total Protein: 6.1 g/dL — ABNORMAL LOW (ref 6.5–8.1)

## 2016-02-04 SURGERY — LAPAROSCOPIC CHOLECYSTECTOMY WITH INTRAOPERATIVE CHOLANGIOGRAM
Anesthesia: General | Site: Abdomen

## 2016-02-04 MED ORDER — FENTANYL CITRATE (PF) 100 MCG/2ML IJ SOLN
INTRAMUSCULAR | Status: DC | PRN
  Administered 2016-02-04 (×2): 25 ug via INTRAVENOUS
  Administered 2016-02-04 (×2): 50 ug via INTRAVENOUS

## 2016-02-04 MED ORDER — GLYCOPYRROLATE 0.2 MG/ML IV SOSY
PREFILLED_SYRINGE | INTRAVENOUS | Status: AC
  Filled 2016-02-04: qty 3

## 2016-02-04 MED ORDER — ONDANSETRON HCL 4 MG/2ML IJ SOLN
INTRAMUSCULAR | Status: AC
  Filled 2016-02-04: qty 2

## 2016-02-04 MED ORDER — BUPIVACAINE-EPINEPHRINE (PF) 0.25% -1:200000 IJ SOLN
INTRAMUSCULAR | Status: AC
  Filled 2016-02-04: qty 30

## 2016-02-04 MED ORDER — FENTANYL CITRATE (PF) 250 MCG/5ML IJ SOLN
INTRAMUSCULAR | Status: AC
  Filled 2016-02-04: qty 5

## 2016-02-04 MED ORDER — HYDROMORPHONE HCL 1 MG/ML IJ SOLN
INTRAMUSCULAR | Status: AC
  Filled 2016-02-04: qty 1

## 2016-02-04 MED ORDER — KETOROLAC TROMETHAMINE 30 MG/ML IJ SOLN
INTRAMUSCULAR | Status: DC | PRN
Start: 2016-02-04 — End: 2016-02-04
  Administered 2016-02-04: 30 mg via INTRAVENOUS

## 2016-02-04 MED ORDER — PROMETHAZINE HCL 25 MG/ML IJ SOLN: 6.2500 mg | INTRAMUSCULAR | Status: DC | PRN

## 2016-02-04 MED ORDER — KETOROLAC TROMETHAMINE 30 MG/ML IJ SOLN
30.0000 mg | Freq: Once | INTRAMUSCULAR | Status: AC | PRN
  Administered 2016-02-04: 30 mg via INTRAVENOUS

## 2016-02-04 MED ORDER — MIDAZOLAM HCL 2 MG/2ML IJ SOLN
INTRAMUSCULAR | Status: AC
  Filled 2016-02-04: qty 2

## 2016-02-04 MED ORDER — LIDOCAINE 2% (20 MG/ML) 5 ML SYRINGE
INTRAMUSCULAR | Status: DC | PRN
  Administered 2016-02-04: 80 mg via INTRAVENOUS

## 2016-02-04 MED ORDER — GLYCOPYRROLATE 0.2 MG/ML IJ SOLN
INTRAMUSCULAR | Status: DC | PRN
  Administered 2016-02-04: .8 mg via INTRAVENOUS

## 2016-02-04 MED ORDER — ROCURONIUM BROMIDE 100 MG/10ML IV SOLN
INTRAVENOUS | Status: DC | PRN
  Administered 2016-02-04: 40 mg via INTRAVENOUS
  Administered 2016-02-04: 20 mg via INTRAVENOUS

## 2016-02-04 MED ORDER — DEXAMETHASONE SODIUM PHOSPHATE 10 MG/ML IJ SOLN
INTRAMUSCULAR | Status: AC
  Filled 2016-02-04: qty 1

## 2016-02-04 MED ORDER — IOPAMIDOL (ISOVUE-300) INJECTION 61%
INTRAVENOUS | Status: AC
  Filled 2016-02-04: qty 50

## 2016-02-04 MED ORDER — OXYCODONE-ACETAMINOPHEN 5-325 MG PO TABS
1.0000 | ORAL_TABLET | ORAL | Status: DC | PRN
  Administered 2016-02-04 – 2016-02-06 (×4): 1 via ORAL
  Filled 2016-02-04 (×4): qty 1

## 2016-02-04 MED ORDER — BUPIVACAINE-EPINEPHRINE 0.25% -1:200000 IJ SOLN
INTRAMUSCULAR | Status: DC | PRN
  Administered 2016-02-04: 20 mL

## 2016-02-04 MED ORDER — SUCCINYLCHOLINE CHLORIDE 200 MG/10ML IV SOSY
PREFILLED_SYRINGE | INTRAVENOUS | Status: AC
  Filled 2016-02-04: qty 10

## 2016-02-04 MED ORDER — SUCCINYLCHOLINE CHLORIDE 200 MG/10ML IV SOSY
PREFILLED_SYRINGE | INTRAVENOUS | Status: DC | PRN
  Administered 2016-02-04: 100 mg via INTRAVENOUS

## 2016-02-04 MED ORDER — GLYCOPYRROLATE 0.2 MG/ML IV SOSY
PREFILLED_SYRINGE | INTRAVENOUS | Status: AC
  Filled 2016-02-04: qty 6

## 2016-02-04 MED ORDER — MIDAZOLAM HCL 5 MG/5ML IJ SOLN
INTRAMUSCULAR | Status: DC | PRN
Start: 2016-02-04 — End: 2016-02-04
  Administered 2016-02-04: 1 mg via INTRAVENOUS

## 2016-02-04 MED ORDER — HYDROMORPHONE HCL 1 MG/ML IJ SOLN
0.2500 mg | INTRAMUSCULAR | Status: DC | PRN
  Administered 2016-02-04 (×2): 0.5 mg via INTRAVENOUS

## 2016-02-04 MED ORDER — KETOROLAC TROMETHAMINE 30 MG/ML IJ SOLN
INTRAMUSCULAR | Status: AC
  Filled 2016-02-04: qty 1

## 2016-02-04 MED ORDER — SODIUM CHLORIDE 0.9 % IR SOLN
Status: DC | PRN
  Administered 2016-02-04: 1000 mL

## 2016-02-04 MED ORDER — NEOSTIGMINE METHYLSULFATE 5 MG/5ML IV SOSY
PREFILLED_SYRINGE | INTRAVENOUS | Status: AC
  Filled 2016-02-04: qty 5

## 2016-02-04 MED ORDER — NEOSTIGMINE METHYLSULFATE 10 MG/10ML IV SOLN
INTRAVENOUS | Status: DC | PRN
  Administered 2016-02-04: 5 mg via INTRAVENOUS

## 2016-02-04 MED ORDER — LIDOCAINE 2% (20 MG/ML) 5 ML SYRINGE
INTRAMUSCULAR | Status: AC
  Filled 2016-02-04: qty 5

## 2016-02-04 MED ORDER — PROPOFOL 10 MG/ML IV BOLUS
INTRAVENOUS | Status: AC
  Filled 2016-02-04: qty 40

## 2016-02-04 MED ORDER — DEXAMETHASONE SODIUM PHOSPHATE 10 MG/ML IJ SOLN
INTRAMUSCULAR | Status: DC | PRN
  Administered 2016-02-04: 10 mg via INTRAVENOUS

## 2016-02-04 MED ORDER — PROPOFOL 10 MG/ML IV BOLUS
INTRAVENOUS | Status: DC | PRN
  Administered 2016-02-04: 120 mg via INTRAVENOUS

## 2016-02-04 MED ORDER — SODIUM CHLORIDE 0.9 % IV SOLN
INTRAVENOUS | Status: DC | PRN
  Administered 2016-02-04: 100 mL

## 2016-02-04 MED ORDER — LACTATED RINGERS IV SOLN
INTRAVENOUS | Status: DC | PRN
  Administered 2016-02-04 (×2): via INTRAVENOUS

## 2016-02-04 MED ORDER — ROCURONIUM BROMIDE 10 MG/ML (PF) SYRINGE
PREFILLED_SYRINGE | INTRAVENOUS | Status: AC
  Filled 2016-02-04: qty 10

## 2016-02-04 MED ORDER — 0.9 % SODIUM CHLORIDE (POUR BTL) OPTIME
TOPICAL | Status: DC | PRN
  Administered 2016-02-04 (×2): 1000 mL

## 2016-02-04 SURGICAL SUPPLY — 38 items
APPLIER CLIP ROT 10 11.4 M/L (STAPLE) ×4
BLADE SURG ROTATE 9660 (MISCELLANEOUS) ×2 IMPLANT
CANISTER SUCTION 2500CC (MISCELLANEOUS) ×2 IMPLANT
CHLORAPREP W/TINT 26ML (MISCELLANEOUS) ×2 IMPLANT
CLIP APPLIE ROT 10 11.4 M/L (STAPLE) ×2 IMPLANT
COVER MAYO STAND STRL (DRAPES) ×2 IMPLANT
COVER SURGICAL LIGHT HANDLE (MISCELLANEOUS) ×2 IMPLANT
DRAPE C-ARM 42X72 X-RAY (DRAPES) ×2 IMPLANT
DRAPE WARM FLUID 44X44 (DRAPE) ×2 IMPLANT
ELECT REM PT RETURN 9FT ADLT (ELECTROSURGICAL) ×2
ELECTRODE REM PT RTRN 9FT ADLT (ELECTROSURGICAL) ×1 IMPLANT
GLOVE BIO SURGEON STRL SZ8 (GLOVE) ×2 IMPLANT
GLOVE BIOGEL PI IND STRL 8 (GLOVE) ×1 IMPLANT
GLOVE BIOGEL PI INDICATOR 8 (GLOVE) ×1
GOWN STRL REUS W/ TWL LRG LVL3 (GOWN DISPOSABLE) ×2 IMPLANT
GOWN STRL REUS W/ TWL XL LVL3 (GOWN DISPOSABLE) ×1 IMPLANT
GOWN STRL REUS W/TWL LRG LVL3 (GOWN DISPOSABLE) ×2
GOWN STRL REUS W/TWL XL LVL3 (GOWN DISPOSABLE) ×1
HEMOSTAT SNOW SURGICEL 2X4 (HEMOSTASIS) ×2 IMPLANT
KIT BASIN OR (CUSTOM PROCEDURE TRAY) ×2 IMPLANT
KIT ROOM TURNOVER OR (KITS) ×2 IMPLANT
LIQUID BAND (GAUZE/BANDAGES/DRESSINGS) ×2 IMPLANT
NS IRRIG 1000ML POUR BTL (IV SOLUTION) ×2 IMPLANT
PAD ARMBOARD 7.5X6 YLW CONV (MISCELLANEOUS) ×2 IMPLANT
POUCH SPECIMEN RETRIEVAL 10MM (ENDOMECHANICALS) ×2 IMPLANT
SCISSORS LAP 5X35 DISP (ENDOMECHANICALS) ×2 IMPLANT
SET CHOLANGIOGRAPH 5 50 .035 (SET/KITS/TRAYS/PACK) ×2 IMPLANT
SET IRRIG TUBING LAPAROSCOPIC (IRRIGATION / IRRIGATOR) ×2 IMPLANT
SLEEVE ENDOPATH XCEL 5M (ENDOMECHANICALS) ×4 IMPLANT
SPECIMEN JAR SMALL (MISCELLANEOUS) ×2 IMPLANT
SUT MNCRL AB 4-0 PS2 18 (SUTURE) ×2 IMPLANT
TOWEL OR 17X24 6PK STRL BLUE (TOWEL DISPOSABLE) ×2 IMPLANT
TOWEL OR 17X26 10 PK STRL BLUE (TOWEL DISPOSABLE) ×2 IMPLANT
TRAY LAPAROSCOPIC MC (CUSTOM PROCEDURE TRAY) ×2 IMPLANT
TROCAR XCEL BLUNT TIP 100MML (ENDOMECHANICALS) ×2 IMPLANT
TROCAR XCEL NON-BLD 11X100MML (ENDOMECHANICALS) ×2 IMPLANT
TROCAR XCEL NON-BLD 5MMX100MML (ENDOMECHANICALS) ×2 IMPLANT
TUBING INSUFFLATION (TUBING) ×2 IMPLANT

## 2016-02-04 NOTE — Anesthesia Preprocedure Evaluation (Signed)
Anesthesia Evaluation  Patient identified by MRN, date of birth, ID band Patient awake    Reviewed: Allergy & Precautions, NPO status , Patient's Chart, lab work & pertinent test results  Airway Mallampati: II  TM Distance: <3 FB Neck ROM: Full    Dental no notable dental hx.    Pulmonary neg pulmonary ROS,    Pulmonary exam normal breath sounds clear to auscultation       Cardiovascular negative cardio ROS Normal cardiovascular exam Rhythm:Regular Rate:Normal     Neuro/Psych negative neurological ROS  negative psych ROS   GI/Hepatic negative GI ROS, Neg liver ROS,   Endo/Other  Morbid obesity  Renal/GU negative Renal ROS  negative genitourinary   Musculoskeletal negative musculoskeletal ROS (+)   Abdominal   Peds negative pediatric ROS (+)  Hematology negative hematology ROS (+)   Anesthesia Other Findings   Reproductive/Obstetrics negative OB ROS                             Anesthesia Physical Anesthesia Plan  ASA: II  Anesthesia Plan: General   Post-op Pain Management:    Induction: Intravenous  Airway Management Planned: Oral ETT  Additional Equipment:   Intra-op Plan:   Post-operative Plan: Extubation in OR  Informed Consent: I have reviewed the patients History and Physical, chart, labs and discussed the procedure including the risks, benefits and alternatives for the proposed anesthesia with the patient or authorized representative who has indicated his/her understanding and acceptance.   Dental advisory given  Plan Discussed with: CRNA and Surgeon  Anesthesia Plan Comments:         Anesthesia Quick Evaluation  

## 2016-02-04 NOTE — Transfer of Care (Signed)
Immediate Anesthesia Transfer of Care Note  Patient: Mary Mann  Procedure(s) Performed: Procedure(s): LAPAROSCOPIC CHOLECYSTECTOMY WITH INTRAOPERATIVE CHOLANGIOGRAM (N/A)  Patient Location: PACU  Anesthesia Type:General  Level of Consciousness: oriented, patient cooperative and responds to stimulation, drowsy  Airway & Oxygen Therapy: Patient Spontanous Breathing and Patient connected to nasal cannula oxygen  Post-op Assessment: Report given to RN and Post -op Vital signs reviewed and stable  Post vital signs: Reviewed and stable  Last Vitals:  Vitals:   02/03/16 1942 02/04/16 0442  BP: (!) 100/56 (!) 113/59  Pulse: (!) 55 (!) 55  Resp: 16 16  Temp: 36.7 C 37 C    Last Pain:  Vitals:   02/04/16 0442  TempSrc: Oral  PainSc:       Patients Stated Pain Goal: 3 (02/04/16 0339)  Complications: No apparent anesthesia complications
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
## 2016-02-04 NOTE — Anesthesia Postprocedure Evaluation (Signed)
Anesthesia Post Note  Patient: Mary CabotOtilia Mann  Procedure(s) Performed: Procedure(s) (LRB): LAPAROSCOPIC CHOLECYSTECTOMY WITH INTRAOPERATIVE CHOLANGIOGRAM (N/A)  Patient location during evaluation: PACU Anesthesia Type: General Level of consciousness: awake and alert Pain management: pain level controlled Vital Signs Assessment: post-procedure vital signs reviewed and stable Respiratory status: spontaneous breathing, nonlabored ventilation, respiratory function stable and patient connected to nasal cannula oxygen Cardiovascular status: blood pressure returned to baseline and stable Postop Assessment: no signs of nausea or vomiting Anesthetic complications: no    Last Vitals:  Vitals:   02/04/16 1000 02/04/16 1010  BP:  127/74  Pulse: (!) 56 70  Resp: (!) 27 (!) 30  Temp:      Last Pain:  Vitals:   02/04/16 1015  TempSrc:   PainSc: 5                  Lillymae Duet S

## 2016-02-04 NOTE — Interval H&P Note (Signed)
History and Physical Interval Note:  02/04/2016 7:37 AM  Mary Mann  has presented today for surgery, with the diagnosis of Cholelithiasis and biliary colic  The various methods of treatment have been discussed with the patient and family. After consideration of risks, benefits and other options for treatment, the patient has consented to  Procedure(s): LAPAROSCOPIC CHOLECYSTECTOMY WITH INTRAOPERATIVE CHOLANGIOGRAM (N/A) as a surgical intervention .  The patient's history has been reviewed, patient examined, no change in status, stable for surgery.  I have reviewed the patient's chart and labs.  Questions were answered to the patient's satisfaction.    The procedure has been discussed with the patient. Operative and non operative treatments have been discussed. Risks of surgery include bleeding, infection,  Common bile duct injury,  Injury to the stomach,liver, colon,small intestine, abdominal wall,  Diaphragm,  Major blood vessels,  And the need for an open procedure.  Other risks include worsening of medical problems, death,  DVT and pulmonary embolism, and cardiovascular events.   Medical options have also been discussed. The patient has been informed of long term expectations of surgery and non surgical options,  The patient agrees to proceed.   Rakeisha Nyce A.

## 2016-02-04 NOTE — Op Note (Signed)
Laparoscopic Cholecystectomy with IOC Procedure Note  Indications: This patient presents with symptomatic gallbladder disease and will undergo laparoscopic cholecystectomy.The procedure has been discussed with the patient. Operative and non operative treatments have been discussed. Risks of surgery include bleeding, infection,  Common bile duct injury,  Injury to the stomach,liver, colon,small intestine, abdominal wall,  Diaphragm,  Major blood vessels,  And the need for an open procedure.  Other risks include worsening of medical problems, death,  DVT and pulmonary embolism, and cardiovascular events.   Medical options have also been discussed. The patient has been informed of long term expectations of surgery and non surgical options,  The patient agrees to proceed.    Pre-operative Diagnosis: Calculus of gallbladder with acute cholecystitis, without mention of obstruction  Post-operative Diagnosis: Calculus of bile duct with acute cholecystitis and obstruction  Surgeon: Harriette BouillonORNETT,Khadeem Rockett A.   Assistants: none   Anesthesia: General endotracheal anesthesia and Local anesthesia 0.25.% bupivacaine, with epinephrine  ASA Class: 2  Procedure Details  The patient was seen again in the Holding Room. The risks, benefits, complications, treatment options, and expected outcomes were discussed with the patient. The possibilities of reaction to medication, pulmonary aspiration, perforation of viscus, bleeding, recurrent infection, finding a normal gallbladder, the need for additional procedures, failure to diagnose a condition, the possible need to convert to an open procedure, and creating a complication requiring transfusion or operation were discussed with the patient. The patient and/or family concurred with the proposed plan, giving informed consent. The site of surgery properly noted/marked. The patient was taken to Operating Room, identified as Mary Mann and the procedure verified as Laparoscopic  Cholecystectomy with Intraoperative Cholangiograms. A Time Out was held and the above information confirmed.  Prior to the induction of general anesthesia, antibiotic prophylaxis was administered. General endotracheal anesthesia was then administered and tolerated well. After the induction, the abdomen was prepped in the usual sterile fashion. The patient was positioned in the supine position with the left arm comfortably tucked, along with some reverse Trendelenburg.  Local anesthetic agent was injected into the skin near the umbilicus and an incision made. The midline fascia was incised and the Hasson technique was used to introduce a 12 mm port under direct vision. It was secured with a figure of eight Vicryl suture placed in the usual fashion. Pneumoperitoneum was then created with CO2 and tolerated well without any adverse changes in the patient's vital signs. Additional trocars were introduced under direct vision with an 11 mm trocar in the epigastrium and 2 5 mm trocars in the right upper quadrant. All skin incisions were infiltrated with a local anesthetic agent before making the incision and placing the trocars.   The gallbladder was identified, the fundus grasped and retracted cephalad. Adhesions were lysed bluntly and with the electrocautery where indicated, taking care not to injure any adjacent organs or viscus. The infundibulum was grasped and retracted laterally, exposing the peritoneum overlying the triangle of Calot. This was then divided and exposed in a blunt fashion. The cystic duct was clearly identified and bluntly dissected circumferentially. The junctions of the gallbladder, cystic duct and common bile duct were clearly identified prior to the division of any linear structure.   An incision was made in the cystic duct and the cholangiogram catheter introduced. The catheter was secured using an endoclip. The study showed occlusion of the distal common bile duct and no flow into the  duodenum  Two attempts to flush out CBD with contrast and saline were unsuccessful.There was  good visualization of the distal and proximal biliary tree. The catheter was then removed. Will consult GI medicine for ERCP.   The cystic duct was then  ligated with surgical clips  on the patient side and  clipped on the gallbladder side and divided. The cystic artery was identified, dissected free, ligated with clips and divided as well. Posterior cystic artery clipped and divided.  The gallbladder was dissected from the liver bed in retrograde fashion with the electrocautery. The gallbladder was removed. The liver bed was irrigated and inspected. Hemostasis was achieved with the electrocautery. Copious irrigation was utilized and was repeatedly aspirated until clear all particulate matter. Hemostasis was achieved with no signs of bleeding or bile leakage.  Pneumoperitoneum was completely reduced after viewing removal of the trocars under direct vision. The wound was thoroughly irrigated and the fascia was then closed with a figure of eight suture; the skin was then closed with 4 O monocryl and a sterile dressing of liquid adhesive  was applied.  Instrument, sponge, and needle counts were correct at closure and at the conclusion of the case.   Findings: Cholecystitis with Cholelithiasis  Estimated Blood Loss: Minimal         Drains: none         Total IV Fluids: 700 mL         Specimens: Gallbladder           Complications: None; patient tolerated the procedure well.         Disposition: PACU - hemodynamically stable.         Condition: stable

## 2016-02-04 NOTE — Anesthesia Procedure Notes (Signed)
Procedure Name: Intubation Date/Time: 02/04/2016 7:58 AM Performed by: Bobbie StackANDERSON, Ladora Osterberg KIRSTEN Pre-anesthesia Checklist: Emergency Drugs available, Patient identified, Suction available and Patient being monitored Patient Re-evaluated:Patient Re-evaluated prior to inductionOxygen Delivery Method: Circle system utilized Preoxygenation: Pre-oxygenation with 100% oxygen Intubation Type: IV induction Ventilation: Mask ventilation without difficulty Laryngoscope Size: 2 and Miller Grade View: Grade I Tube type: Oral Tube size: 7.0 mm Number of attempts: 1 Airway Equipment and Method: Stylet Placement Confirmation: ETT inserted through vocal cords under direct vision,  positive ETCO2 and breath sounds checked- equal and bilateral Secured at: 22 cm Tube secured with: Tape Dental Injury: Teeth and Oropharynx as per pre-operative assessment

## 2016-02-05 ENCOUNTER — Observation Stay (HOSPITAL_COMMUNITY): Payer: Self-pay | Admitting: Critical Care Medicine

## 2016-02-05 ENCOUNTER — Encounter (HOSPITAL_COMMUNITY): Payer: Self-pay | Admitting: *Deleted

## 2016-02-05 ENCOUNTER — Encounter (HOSPITAL_COMMUNITY): Admission: EM | Disposition: A | Discharge status: Home / Self Care | Payer: Self-pay | Source: Home / Self Care

## 2016-02-05 ENCOUNTER — Observation Stay (HOSPITAL_COMMUNITY): Payer: Self-pay

## 2016-02-05 DIAGNOSIS — K8050 Calculus of bile duct without cholangitis or cholecystitis without obstruction: Secondary | ICD-10-CM

## 2016-02-05 HISTORY — PX: ERCP: SHX5425

## 2016-02-05 SURGERY — ERCP, WITH INTERVENTION IF INDICATED
Anesthesia: General

## 2016-02-05 MED ORDER — FENTANYL CITRATE (PF) 100 MCG/2ML IJ SOLN
INTRAMUSCULAR | Status: DC | PRN
  Administered 2016-02-05: 50 ug via INTRAVENOUS

## 2016-02-05 MED ORDER — SODIUM CHLORIDE 0.9 % IV SOLN
INTRAVENOUS | Status: DC | PRN
  Administered 2016-02-05: 25 mL

## 2016-02-05 MED ORDER — ROCURONIUM BROMIDE 100 MG/10ML IV SOLN
INTRAVENOUS | Status: DC | PRN
  Administered 2016-02-05: 40 mg via INTRAVENOUS

## 2016-02-05 MED ORDER — PROPOFOL 10 MG/ML IV BOLUS
INTRAVENOUS | Status: DC | PRN
  Administered 2016-02-05: 170 mg via INTRAVENOUS

## 2016-02-05 MED ORDER — CIPROFLOXACIN IN D5W 400 MG/200ML IV SOLN
INTRAVENOUS | Status: DC | PRN
  Administered 2016-02-05: 400 mg via INTRAVENOUS

## 2016-02-05 MED ORDER — INDOMETHACIN 50 MG RE SUPP
RECTAL | Status: AC
  Filled 2016-02-05: qty 2

## 2016-02-05 MED ORDER — LACTATED RINGERS IV SOLN
INTRAVENOUS | Status: DC | PRN
  Administered 2016-02-05: 11:00:00 via INTRAVENOUS

## 2016-02-05 MED ORDER — INDOMETHACIN 50 MG RE SUPP
RECTAL | Status: DC | PRN
  Administered 2016-02-05: 100 mg via RECTAL

## 2016-02-05 MED ORDER — SUGAMMADEX SODIUM 500 MG/5ML IV SOLN
INTRAVENOUS | Status: DC | PRN
  Administered 2016-02-05: 500 mg via INTRAVENOUS

## 2016-02-05 MED ORDER — LIDOCAINE HCL (CARDIAC) 20 MG/ML IV SOLN
INTRAVENOUS | Status: DC | PRN
  Administered 2016-02-05: 100 mg via INTRAVENOUS

## 2016-02-05 MED ORDER — CIPROFLOXACIN IN D5W 400 MG/200ML IV SOLN
INTRAVENOUS | Status: AC
  Filled 2016-02-05: qty 200

## 2016-02-05 MED ORDER — IOPAMIDOL (ISOVUE-300) INJECTION 61%
INTRAVENOUS | Status: AC
  Filled 2016-02-05: qty 50

## 2016-02-05 NOTE — Discharge Instructions (Signed)
Your appointment is at 10:15 AM on Wednesday 02/21/16, please arrive at least 30 min before your appointment to complete your check in paperwork.  If you are unable to arrive 30 min prior to your appointment time we may have to cancel or reschedule you.  LAPAROSCOPIC SURGERY: POST OP INSTRUCTIONS  1. DIET: Follow a light bland diet the first 24 hours after arrival home, such as soup, liquids, crackers, etc. Be sure to include lots of fluids daily. Avoid fast food or heavy meals as your are more likely to get nauseated. Eat a low fat the next few days after surgery.  2. Take your usually prescribed home medications unless otherwise directed. 3. PAIN CONTROL:  1. Pain is best controlled by a usual combination of three different methods TOGETHER:  1. Ice/Heat 2. Over the counter pain medication 3. Prescription pain medication 2. Most patients will experience some swelling and bruising around the incisions. Ice packs or heating pads (30-60 minutes up to 6 times a day) will help. Use ice for the first few days to help decrease swelling and bruising, then switch to heat to help relax tight/sore spots and speed recovery. Some people prefer to use ice alone, heat alone, alternating between ice & heat. Experiment to what works for you. Swelling and bruising can take several weeks to resolve.  3. It is helpful to take an over-the-counter pain medication regularly for the first few weeks. Choose one of the following that works best for you:  1. Naproxen (Aleve, etc) Two  tabs twice a day 2. Ibuprofen (Advil, etc) Three  tabs four times a day (every meal & bedtime) 3. Acetaminophen (Tylenol, etc) 500-650mg  four times a day (every meal & bedtime) 4. A prescription for pain medication (such as oxycodone, hydrocodone, etc) should be given to you upon discharge. Take your pain medication as prescribed.  1. If you are having problems/concerns with the prescription medicine (does not control pain, nausea,  vomiting, rash, itching, etc), please call us [phone] to see if we need to switch you to a different pain medicine that will work better for you and/or control your side effect better. 2. If you need a refill on your pain medication, please contact your pharmacy. They will contact our office to request authorization. Prescriptions will not be filled after 5 pm or on week-ends. 4. Avoid getting constipated. Between the surgery and the pain medications, it is common to experience some constipation. Increasing fluid intake and taking a fiber supplement (such as Metamucil, Citrucel, FiberCon, MiraLax, etc) 1-2 times a day regularly will usually help prevent this problem from occurring. A mild laxative (prune juice, Milk of Magnesia, MiraLax, etc) should be taken according to package directions if there are no bowel movements after 48 hours.  5. Watch out for diarrhea. If you have many loose bowel movements, simplify your diet to bland foods & liquids for a few days. Stop any stool softeners and decrease your fiber supplement. Switching to mild anti-diarrheal medications (Kayopectate, Pepto Bismol) can help. If this worsens or does not improve, please call us. 6. Wash / shower every day. You may shower over the dressings as they are waterproof. Continue to shower over incision(s) after the dressing is off. 7. Remove your waterproof bandages 5 days after surgery. You may leave the incision open to air. You may replace a dressing/Band-Aid to cover the incision for comfort if you wish.  8. ACTIVITIES as tolerated:  1. You may resume regular (light) daily activities beginning  the next day--such as daily self-care, walking, climbing stairs--gradually increasing activities as tolerated. If you can walk 30 minutes without difficulty, it is safe to try more intense activity such as jogging, treadmill, bicycling, low-impact aerobics, swimming, etc. 2. Save the most intensive and strenuous activity for last such as  sit-ups, heavy lifting, contact sports, etc Refrain from any heavy lifting or straining until you are off narcotics for pain control.  3. DO NOT PUSH THROUGH PAIN. Let pain be your guide: If it hurts to do something, don't do it. Pain is your body warning you to avoid that activity for another week until the pain goes down. 4. You may drive when you are no longer taking prescription pain medication, you can comfortably wear a seatbelt, and you can safely maneuver your car and apply brakes. 5. You may have sexual intercourse when it is comfortable.  9. FOLLOW UP in our office  1. Please call [facility] at [phone] to set up an appointment to see your surgeon in the office for a follow-up appointment approximately 2-3 weeks after your surgery. 2. Make sure that you call for this appointment the day you arrive home to insure a convenient appointment time.      10. IF YOU HAVE DISABILITY OR FAMILY LEAVE FORMS, BRING THEM TO THE               OFFICE FOR PROCESSING.   WHEN TO CALL US [phone](336) [phone]:  1. Poor pain control 2. Reactions / problems with new medications (rash/itching, nausea, etc)  3. Fever over 101.5 F (38.5 C) 4. Inability to urinate 5. Nausea and/or vomiting 6. Worsening swelling or bruising 7. Continued bleeding from incision. 8. Increased pain, redness, or drainage from the incision  The clinic staff is available to answer your questions during regular business hours (8:30am-5pm). Please dont hesitate to call and ask to speak to one of our nurses for clinical concerns.  If you have a medical emergency, go to the nearest emergency room or call 911.  A surgeon from [facility] is always on call at the [facility]   [facility], [place]  [address], [address], [place], [place] [address] ?  MAIN: [phone] ? TOLL FREE: [phone] ?  FAX [phone](336) [phone]  [email or web address]

## 2016-02-05 NOTE — Progress Notes (Signed)
[facility] Progress Note  Day of Surgery  Subjective: Mother in the room with her. Pt just returned from ERCP where one, 5 mm stone was removed from CBD. Reports abdominal soreness. Tolerating clears without N/V. Denies flatus and BM. Ambulating.  Objective: Vital signs in last 24 hours: Temp:  [97.5 F (36.4 C)-98.3 F (36.8 C)] 97.7 F (36.5 C) (08/07 1300) Pulse Rate:  [50-78] 50 (08/07 1319) Resp:  [18-30] 26 (08/07 1319) BP: (91-122)/(48-70) 116/63 (08/07 1319) SpO2:  [84 %-99 %] 95 % (08/07 1319) Last BM Date: 02/02/16  Intake/Output from previous day: 08/06 0701 - 08/07 0700 In: 1500 [I.V.:1200] Out: 5 [Blood:5] Intake/Output this shift: Total I/O In: 500 [I.V.:500] Out: 10 [Blood:10]  PE: Gen:  Alert, NAD, pleasant Card:  RRR, no M/G/R appreciated Pulm:  CTA, no W/R/R Abd: Soft, appropriately tender, ND, +BS, no HSM, incisions C/D/I Ext:  No erythema, edema, or tenderness  Lab Results:   Recent Labs  02/03/16 0420 02/04/16 0514  WBC 16.5* 9.1  HGB 14.5 12.4  HCT 43.4 39.3  PLT 335 265   BMET  Recent Labs  02/03/16 0420 02/04/16 0514  NA 133* 135  K 4.7 4.0  CL 94* 99*  CO2 30 32  GLUCOSE 146* 140*  BUN <5* <5*  CREATININE 0.58 0.55  CALCIUM 10.1 8.2*   PT/INR No results for input(s): LABPROT, INR in the last 72 hours. CMP     Component Value Date/Time   NA 135 02/04/2016 0514   K 4.0 02/04/2016 0514   CL 99 (L) 02/04/2016 0514   CO2 32 02/04/2016 0514   GLUCOSE 140 (H) 02/04/2016 0514   BUN <5 (L) 02/04/2016 0514   CREATININE 0.55 02/04/2016 0514   CREATININE 0.55 06/08/2013 0936   CALCIUM 8.2 (L) 02/04/2016 0514   PROT 6.1 (L) 02/04/2016 0514   ALBUMIN 2.8 (L) 02/04/2016 0514   AST 373 (H) 02/04/2016 0514   ALT 461 (H) 02/04/2016 0514   ALKPHOS 134 (H) 02/04/2016 0514   BILITOT 2.7 (H) 02/04/2016 0514   GFRNONAA >60 02/04/2016 0514   GFRNONAA >89 06/08/2013 0936   GFRAA >60 02/04/2016 0514   GFRAA >89  06/08/2013 0936   Lipase     Component Value Date/Time   LIPASE 15 02/03/2016 0420   Studies/Results: Dg Cholangiogram Operative  Result Date: 02/04/2016 CLINICAL DATA:  Intraoperative cholangiogram.  Cholelithiasis. FLUOROSCOPY TIME:  19 seconds. Images: 2 EXAM: INTRAOPERATIVE CHOLANGIOGRAM TECHNIQUE: Cholangiographic images from the C-arm fluoroscopic device were submitted for interpretation post-operatively. Please see the procedural report for the amount of contrast and the fluoroscopy time utilized. COMPARISON:  None FINDINGS: Two images were obtained. The first image demonstrates no filling defects. The second image demonstrates a rounded defect at the bifurcation of the common hepatic duct. No other filling defects. IMPRESSION: A rounded filling defect at the bifurcation of the common hepatic duct is seen on the second of 2 images. Based on imaging, this could be a bubble or stone. The fact that it was not present on the first image suggests it may be more likely to be a bubble. Recommend clinical correlation. Electronically Signed   By: Gerome Samavid  Williams III M.D   On: 02/04/2016 09:16   Dg Ercp Biliary & Pancreatic Ducts  Result Date: 02/05/2016 CLINICAL DATA:  35 year old female with a history of choledocholithiasis EXAM: ERCP TECHNIQUE: Multiple spot images obtained with the fluoroscopic device and submitted for interpretation post-procedure. FLUOROSCOPY TIME:  2 minutes 10 second COMPARISON:  Intraoperative  cholangiogram 02/04/2016 FINDINGS: Limited intraoperative fluoroscopic spot images during ERCP. Initial image demonstrates endoscope projecting over the upper abdomen with surgical clamp projecting over the abdomen. Surgical changes of cholecystectomy. Subsequent image demonstrates cannulation of the ampulla and retrograde infusion of contrast. Incomplete opacification of the extrahepatic biliary ductal system. Final image with guidewire remaining in position. IMPRESSION: Limited images  during ERCP demonstrates partial opacification of extrahepatic biliary tree. Please refer to the dictated operative report for full details of intraoperative findings and procedure. Signed, Yvone Neu. Loreta Ave, DO [facility] Electronically Signed   By: Gilmer Mor D.O.   On: 02/05/2016 12:57   Anti-infectives: Anti-infectives    Start     Dose/Rate Route Frequency Ordered Stop   02/03/16 1200  ciprofloxacin (CIPRO) IVPB 400 mg  Status:  Discontinued     400 mg 200 mL/hr over 60 Minutes Intravenous Every 12 hours 02/03/16 1157 02/05/16 1353   02/03/16 1200  metroNIDAZOLE (FLAGYL) IVPB 500 mg  Status:  Discontinued     500 mg 100 mL/hr over 60 Minutes Intravenous Every 8 hours 02/03/16 1157 02/05/16 1353     Assessment/Plan Symptomatic cholelithiasis with biliary colic  - s/p laparoscopic cholecystectomy with IOC -Dr. Luisa Hart 02/04/16 Choledocholithiasis  - s/p ERCP with sphincterotomy; removal of 5 mm CBD stone -Dr. Stan Head 02/05/16 - WBC 9.1  - CMET ordered for AM   FEN: clear liquid diet, advance to full liquids this evening.  ID: cipro/flagyl day #3 DVT Proph: SCD's, Lovenox held today for ERCP - can re-start tomorrow Plan: check CMET in AM, likely discharge tomorrow if LFT's are trending back down towards normal and patient tolerating diet. Patient will need a work note at discharge.    LOS: 0 days    Adam Phenix , [facility] 02/05/2016, 2:01 PM Pager: [phone] Consults: [phone] Mon-Fri 7:00 am-4:30 pm Sat-Sun 7:00 am-11:30 am

## 2016-02-05 NOTE — Transfer of Care (Signed)
Immediate Anesthesia Transfer of Care Note  Patient: Mary Mann  Procedure(s) Performed: Procedure(s): ENDOSCOPIC RETROGRADE CHOLANGIOPANCREATOGRAPHY (ERCP) (N/A)  Patient Location: Endoscopy Unit  Anesthesia Type:General  Level of Consciousness: sedated  Airway & Oxygen Therapy: Patient Spontanous Breathing and Patient connected to face mask oxygen  Post-op Assessment: Report given to RN, Post -op Vital signs reviewed and stable and Patient moving all extremities X 4  Post vital signs: Reviewed and stable  Last Vitals:  Vitals:   02/05/16 1048 02/05/16 1240  BP: (!) 114/58 (!) 105/54  Pulse: (!) 51 78  Resp: (!) 23 (!) 25  Temp: 36.5 C     Last Pain:  Vitals:   02/05/16 1048  TempSrc: Oral  PainSc:       Patients Stated Pain Goal: 3 (02/04/16 1015)  Complications: No apparent anesthesia complications

## 2016-02-05 NOTE — Anesthesia Procedure Notes (Signed)
Procedure Name: Intubation Date/Time: 02/05/2016 11:59 AM Performed by: Sharlene DoryWALKER, Chinelo Benn E Pre-anesthesia Checklist: Patient identified, Emergency Drugs available, Suction available, Patient being monitored and Timeout performed Patient Re-evaluated:Patient Re-evaluated prior to inductionOxygen Delivery Method: Circle system utilized Preoxygenation: Pre-oxygenation with 100% oxygen Intubation Type: IV induction Ventilation: Mask ventilation without difficulty Laryngoscope Size: Mac and 3 Grade View: Grade I Tube type: Oral Tube size: 7.0 mm Number of attempts: 1 Airway Equipment and Method: Stylet Placement Confirmation: ETT inserted through vocal cords under direct vision,  CO2 detector and breath sounds checked- equal and bilateral Secured at: 21 cm Tube secured with: Tape Dental Injury: Teeth and Oropharynx as per pre-operative assessment

## 2016-02-05 NOTE — Anesthesia Postprocedure Evaluation (Signed)
Anesthesia Post Note  Patient: Mary CabotOtilia Mann  Procedure(s) Performed: Procedure(s) (LRB): ENDOSCOPIC RETROGRADE CHOLANGIOPANCREATOGRAPHY (ERCP) (N/A)  Patient location during evaluation: PACU Anesthesia Type: General Level of consciousness: awake and alert Pain management: pain level controlled Vital Signs Assessment: post-procedure vital signs reviewed and stable Respiratory status: spontaneous breathing, nonlabored ventilation and respiratory function stable Cardiovascular status: blood pressure returned to baseline and stable Postop Assessment: no signs of nausea or vomiting Anesthetic complications: no    Last Vitals:  Vitals:   02/05/16 1319 02/05/16 1437  BP: 116/63 114/60  Pulse: (!) 50 62  Resp: (!) 26 20  Temp:  36.3 C    Last Pain:  Vitals:   02/05/16 1437  TempSrc: Oral  PainSc:                  Linton RumpJennifer Dickerson Jusitn Salsgiver

## 2016-02-05 NOTE — Op Note (Signed)
[facility] Patient Name: Mary Mann Procedure Date : 02/05/2016 MRN: [id number] Attending MD: Iva Boop , MD Date of Birth: [birth year] CSN: [id number] Age: 35 Admit Type: Inpatient Procedure:                ERCP Indications:              Suspected bile duct stone(s) Providers:                Iva Boop, MD, Harold Barban, RN, Oletha Blend, Technician Referring MD:              Medicines:                General Anesthesia Complications:            No immediate complications. Estimated Blood Loss:     Estimated blood loss: none. Procedure:                Pre-Anesthesia Assessment:                           - Prior to the procedure, a History and Physical                            was performed, and patient medications and                            allergies were reviewed. The patient's tolerance of                            previous anesthesia was also reviewed. The risks                            and benefits of the procedure and the sedation                            options and risks were discussed with the patient.                            All questions were answered, and informed consent                            was obtained. Prior Anticoagulants: The patient has                            taken no previous anticoagulant or antiplatelet                            agents. ASA Grade Assessment: II - A patient with                            mild systemic disease. After reviewing the risks  and benefits, the patient was deemed in                            satisfactory condition to undergo the procedure.                           After obtaining informed consent, the scope was                            passed under direct vision. Throughout the                            procedure, the patient's blood pressure, pulse, and                            oxygen saturations were monitored  continuously. The                            [id number] ([id number] scope was introduced through                            the mouth, and used to inject contrast into and                            used to inject contrast into the bile duct. The                            ERCP was accomplished without difficulty. The                            patient tolerated the procedure well. Scope In: Scope Out: Findings:      The upper GI tract was traversed under direct vision without detailed       examination. The major papilla was normal. The major papilla was normal.       The bile duct was deeply cannulated with the short-nosed traction       sphincterotome. Contrast was injected. The common bile duct was       diffusely dilated. The largest diameter was 8 mm. A cholecystectomy had       been performed. The common bile duct contained one stone, which was 5 mm       in diameter. The biliary tree was otherwise normal. The biliary tree was       otherwise normal. A 5 mm biliary sphincterotomy was made with a traction       (standard) sphincterotome using ERBE electrocautery. There was no       post-sphincterotomy bleeding. To discover objects, the biliary tree was       swept with a 12 mm balloon starting at the upper third of the main bile       duct. One stone was removed. No stones remained. Impression:               - The major papilla appeared normal.                           - The common bile  duct was dilated.                           - The patient has had a cholecystectomy.                           - Choledocholithiasis was found. Complete removal                            was accomplished by biliary sphincterotomy and                            balloon extraction.                           - A biliary sphincterotomy was performed.                           - The biliary tree was swept. Recommendation:           - Return patient to hospital ward for ongoing care.                            - DC Abx                           will check in AM - clears for now Procedure Code(s):        --- Professional ---                           [id number], Endoscopic retrograde                            cholangiopancreatography (ERCP); with removal of                            calculi/debris from biliary/pancreatic duct(s)                           43262, Endoscopic retrograde                            cholangiopancreatography (ERCP); with                            sphincterotomy/papillotomy Diagnosis Code(s):        --- Professional ---                           Z90.49, Acquired absence of other specified parts                            of digestive tract                           K80.50, Calculus of bile duct without cholangitis  or cholecystitis without obstruction                           K83.8, Other specified diseases of biliary tract CPT copyright 2016 American Medical Association. All rights reserved. The codes documented in this report are preliminary and upon coder review may  be revised to meet current compliance requirements. Iva Boop, MD 02/05/2016 12:44:16 PM This report has been signed electronically. Number of Addenda: 0

## 2016-02-05 NOTE — Anesthesia Preprocedure Evaluation (Addendum)
Anesthesia Evaluation  Patient identified by MRN, date of birth, ID band Patient awake    Reviewed: Allergy & Precautions, NPO status , Patient's Chart, lab work & pertinent test results  History of Anesthesia Complications Negative for: history of anesthetic complications  Airway Mallampati: III  TM Distance: >3 FB Neck ROM: Full    Dental  (+) Teeth Intact, Dental Advisory Given   Pulmonary neg pulmonary ROS,    Pulmonary exam normal breath sounds clear to auscultation       Cardiovascular negative cardio ROS Normal cardiovascular exam Rhythm:Regular Rate:Normal     Neuro/Psych negative neurological ROS     GI/Hepatic negative GI ROS, cholelithiasis   Endo/Other  neg diabetesMorbid obesity  Renal/GU Renal disease (h/o kidney stones)     Musculoskeletal   Abdominal (+) + obese,   Peds  Hematology negative hematology ROS (+)   Anesthesia Other Findings   Reproductive/Obstetrics                            Anesthesia Physical Anesthesia Plan  ASA: III  Anesthesia Plan: General   Post-op Pain Management:    Induction: Intravenous  Airway Management Planned: Oral ETT  Additional Equipment:   Intra-op Plan:   Post-operative Plan: Extubation in OR  Informed Consent: I have reviewed the patients History and Physical, chart, labs and discussed the procedure including the risks, benefits and alternatives for the proposed anesthesia with the patient or authorized representative who has indicated his/her understanding and acceptance.   Dental advisory given  Plan Discussed with: Anesthesiologist  Anesthesia Plan Comments:       Anesthesia Quick Evaluation

## 2016-02-06 ENCOUNTER — Encounter (HOSPITAL_COMMUNITY): Payer: Self-pay | Admitting: Internal Medicine

## 2016-02-06 ENCOUNTER — Encounter: Payer: Self-pay | Admitting: General Surgery

## 2016-02-06 LAB — COMPREHENSIVE METABOLIC PANEL
ALBUMIN: 2.8 g/dL — AB (ref 3.5–5.0)
ALK PHOS: 134 U/L — AB (ref 38–126)
ALT: 295 U/L — ABNORMAL HIGH (ref 14–54)
ANION GAP: 5 (ref 5–15)
AST: 124 U/L — ABNORMAL HIGH (ref 15–41)
BILIRUBIN TOTAL: 1 mg/dL (ref 0.3–1.2)
CALCIUM: 8.3 mg/dL — AB (ref 8.9–10.3)
CO2: 27 mmol/L (ref 22–32)
Chloride: 105 mmol/L (ref 101–111)
Creatinine, Ser: 0.63 mg/dL (ref 0.44–1.00)
GFR calc Af Amer: >60 mL/min (ref >60)
GLUCOSE: 89 mg/dL (ref 65–99)
Potassium: 3.4 mmol/L — ABNORMAL LOW (ref 3.5–5.1)
Sodium: 137 mmol/L (ref 135–145)
TOTAL PROTEIN: 6.1 g/dL — AB (ref 6.5–8.1)

## 2016-02-06 MED ORDER — OXYCODONE-ACETAMINOPHEN 5-325 MG PO TABS: 1.0000 | ORAL_TABLET | Freq: Four times a day (QID) | ORAL | 0 refills | Status: AC | PRN

## 2016-02-06 MED ORDER — FAMOTIDINE 20 MG PO TABS
20.0000 mg | ORAL_TABLET | Freq: Every day | ORAL | Status: DC
  Administered 2016-02-06: 20 mg via ORAL
  Filled 2016-02-06: qty 1

## 2016-02-06 NOTE — Discharge Summary (Signed)
[facility] Discharge Summary   Patient ID: Mary CabotOtilia Mann MRN: [id number] DOB/AGE: 32/08/1980 35 y.o.  Admit date: 02/03/2016 Discharge date: 02/06/2016  Admitting Diagnosis: Symptomatic cholelithiasis  Discharge Diagnosis Patient Active Problem List   Diagnosis Date Noted  . Choledocholithiasis   . Symptomatic cholelithiasis 02/03/2016  . Irregular periods 06/08/2013  . Obesity, unspecified 06/08/2013    Consultants Gastroenterology - Dr. Reeves ForthSteven Paul Armbruster  Imaging: Dg Ercp Biliary & Pancreatic Ducts  Result Date: 02/05/2016 CLINICAL DATA:  35 year old female with a history of choledocholithiasis EXAM: ERCP TECHNIQUE: Multiple spot images obtained with the fluoroscopic device and submitted for interpretation post-procedure. FLUOROSCOPY TIME:  2 minutes 10 second COMPARISON:  Intraoperative cholangiogram 02/04/2016 FINDINGS: Limited intraoperative fluoroscopic spot images during ERCP. Initial image demonstrates endoscope projecting over the upper abdomen with surgical clamp projecting over the abdomen. Surgical changes of cholecystectomy. Subsequent image demonstrates cannulation of the ampulla and retrograde infusion of contrast. Incomplete opacification of the extrahepatic biliary ductal system. Final image with guidewire remaining in position. IMPRESSION: Limited images during ERCP demonstrates partial opacification of extrahepatic biliary tree. Please refer to the dictated operative report for full details of intraoperative findings and procedure. Signed, Yvone NeuJaime S. Loreta AveWagner, DO [facility] Electronically Signed   By: Gilmer MorJaime  Wagner D.O.   On: 02/05/2016 12:57  02/04/16 - DG cholangiogram operative - rounded filling defect at bifurcation of common hepatic duct  02/03/16 - U/S RUQ - several large gallstones, pericholecystic fluid  Procedures Dr. Maisie Fushomas cornett (02/04/16) - Laparoscopic Cholecystectomy with IOC Dr. Stan Headarl  Gessner (02/05/16) - ERCP with sphincterotomy and balloon extraction of CBD stone  Hospital Course:  35 y.o. Female [facility] several months of RUQ pain, worsening over past few days.  Workup suggested cholelithiasis without cholecystitis, but leukocytosis and mild elevations in LFT's were present.  Patient was admitted and underwent procedure listed above by Dr. Luisa Hartornett.  During the procedure an intra-operative cholangiogram suggested a possible common bile duct stone and GI was consulted for possible ERCP with stone removal. The patient underwent an ERCP the following morning and tolerated the procedure well. She was transferred to the floor.  Diet was advanced as tolerated.  On POD#2, the patient was voiding well, tolerating diet, ambulating well, pain well controlled, vital signs stable, incisions c/d/i and felt stable for discharge home.  Patient will follow up in our office in 2 weeks and knows to call with questions or concerns.  A work note was provided.  Physical Exam: General:  Alert, NAD, pleasant, comfortable Abd:  Soft, ND, mild tenderness, incisions C/D/I    Medication List    TAKE these medications   loperamide 2 MG capsule Commonly known as:  IMODIUM Take 2 mg by mouth daily as needed for diarrhea or loose stools.   omeprazole 20 MG capsule Commonly known as:  PRILOSEC Take 20 mg by mouth daily as needed (acid reflux).   oxyCODONE-acetaminophen 5-325 MG tablet Commonly known as:  PERCOCET/ROXICET Take 1 tablet by mouth every 6 (six) hours as needed for moderate pain.   ranitidine 150 MG tablet Commonly known as:  ZANTAC Take 150 mg by mouth 2 (two) times daily as needed for heartburn.       Follow-up Information    [facility] [place].   Specialty:  General Surgery Why:  Your appointment for post operative follow-up is at 10:15 AM. Please arrive 30 minutes early (9:45 AM) to get checked in and  fill out any necessary  paperwork. Contact information: [address] [place] [address] [phone]          Signed: Hosie Spangle, [facility] 02/06/2016, 1:07 PM Pager: [phone] Consults: [phone] Mon-Fri 7:00 am-4:30 pm Sat-Sun 7:00 am-11:30 am

## 2016-02-06 NOTE — Progress Notes (Signed)
Reviewed discharge information/medication with patient.  Answered all of her questions.

## 2016-02-06 NOTE — Progress Notes (Signed)
          Daily Rounding Note  02/06/2016, 8:35 AM  LOS: 1 day   SUBJECTIVE:   Chief complaint:  Abdominal pain   Abdominal pain improved overall.  Sore from surgery.  No N/V.  Tolerating liquids. Had BM yesterday.  Asking about getting a work excuse from her job.   OBJECTIVE:         Vital signs in last 24 hours:    Temp:  [97.4 F (36.3 C)-98.7 F (37.1 C)] 98.7 F (37.1 C) (08/08 0650) Pulse Rate:  [50-78] 67 (08/08 0650) Resp:  [18-30] 18 (08/08 0650) BP: (105-121)/(47-67) 109/58 (08/08 0650) SpO2:  [84 %-99 %] 95 % (08/08 0650) Last BM Date: 02/02/16 Filed Weights   02/03/16 0412  Weight: 97.5 kg (215 lb)   General: looks well, just sleepy.  comfortable   Heart: RRR Chest: clear bil.   Abdomen: soft, ND, mild to minimal tenderness.  BS hypoactive.  Surgical incisions CDI.  Extremities: no CCE Neuro/Psych:  Pleasant, alert, oriented x 3.  No tremor or gross deficits.     Lab Results:  Recent Labs  02/04/16 0514  WBC 9.1  HGB 12.4  HCT 39.3  PLT 265   BMET  Recent Labs  02/04/16 0514 02/06/16 0238  NA 135 137  K 4.0 3.4*  CL 99* 105  CO2 32 27  GLUCOSE 140* 89  BUN <5* <5*  CREATININE 0.55 0.63  CALCIUM 8.2* 8.3*   LFT  Recent Labs  02/04/16 0514 02/06/16 0238  PROT 6.1* 6.1*  ALBUMIN 2.8* 2.8*  AST 373* 124*  ALT 461* 295*  ALKPHOS 134* 134*  BILITOT 2.7* 1.0    ASSESMENT:   *  S/p cholecystectomy.  CBD stone on IOC S/p ERCP with sphincterotomy, balloon extraction of CBD stone LFTs trending down.    PLAN   *  Cleared for discharge home from GI view.  Follow up with genl surgery, no GI follow up unless there are non-surgical GI issues.  [facility].     Jennye MoccasinSarah Gribbin  02/06/2016, 8:35 AM Pager: [phone]     GI Attending   I have taken an interval history, reviewed the chart and examined the patient. I agree with the Advanced Practitioner's note, impression  and recommendations.

## 2016-02-06 NOTE — Plan of Care (Signed)
Problem: Safety: Goal: Ability to remain free from injury will improve Outcome: Progressing No fall or injury noted, safety precautions maintained  Problem: Pain Managment: Goal: General experience of comfort will improve Outcome: Progressing Denies pain and discomfort  Problem: Skin Integrity: Goal: Risk for impaired skin integrity will decrease Outcome: Progressing All 4 lap. Sites with Dermobond dry and intact. No redness or drainage noted  Problem: Activity: Goal: Risk for activity intolerance will decrease Outcome: Progressing Up ad lib ambulating in the room independently  Problem: Nutrition: Goal: Adequate nutrition will be maintained Outcome: Progressing Tolerating full liquid diet well  Problem: Bowel/Gastric: Goal: Will not experience complications related to bowel motility Outcome: Progressing Last BM on the 4th, passing gas, no distention noted, active bowel sound all quadrants, denies nausea or vomiting

## 2018-02-03 IMAGING — CT CT RENAL STONE PROTOCOL
2 of 4 series · 15 of 46 positions shown, 17 images · non-contrast
Comparison: CT abdomen 07/11/2012

CLINICAL DATA: Epigastric pains x3 months but worse now some N/V

EXAM:
CT ABDOMEN AND PELVIS WITHOUT CONTRAST
TECHNIQUE: Multidetector CT imaging of the abdomen and pelvis was performed
following the standard protocol without IV contrast.

[Series 2: renal stone 5mm · axial · 0.71mm/px · z∈[+1089,+1494]mm · 12 of 97 slices shown, 14 images]
[im 8/97  soft-tissue]
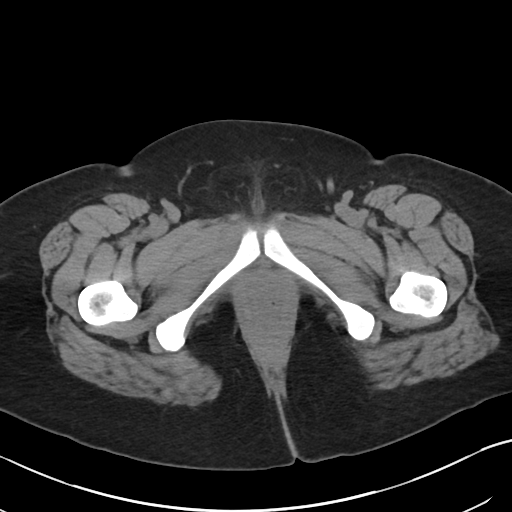
[im 8/97  bone]
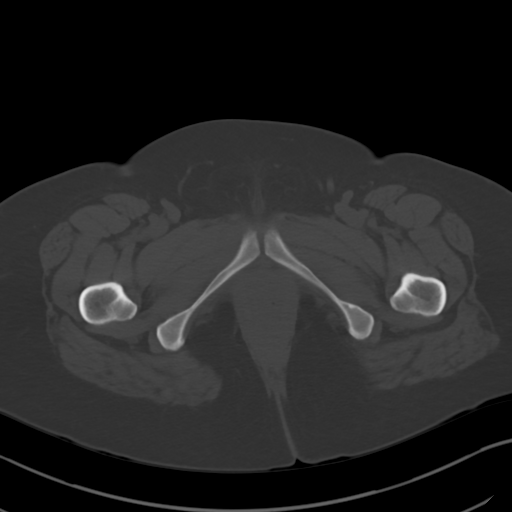
[im 15/97  soft-tissue]
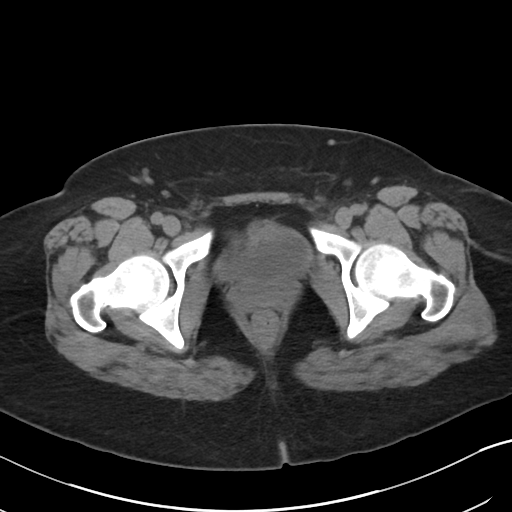
[im 23/97  soft-tissue]
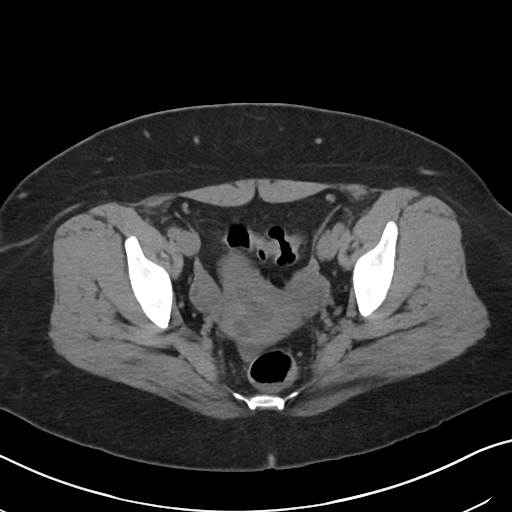
[im 30/97  soft-tissue]
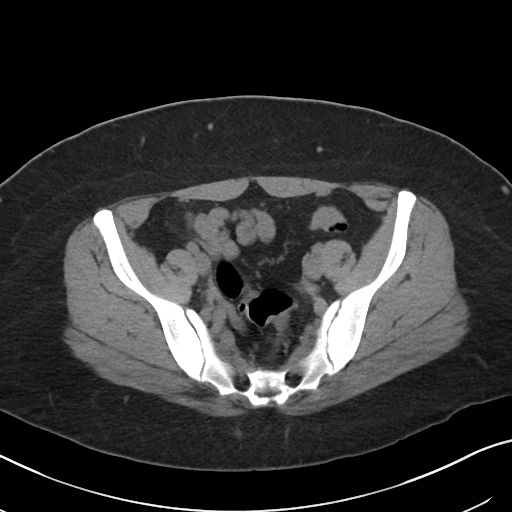
[im 37/97  soft-tissue]
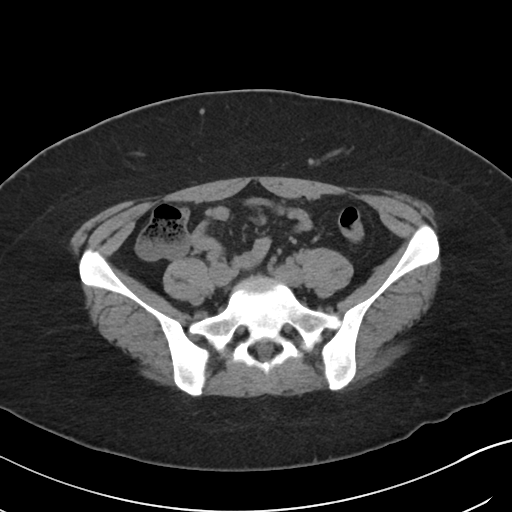
[im 45/97  soft-tissue]
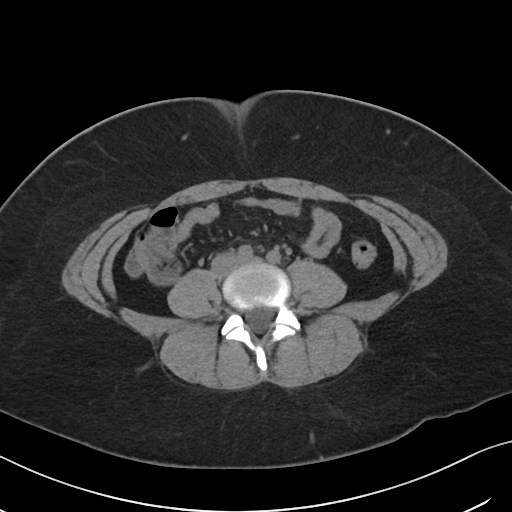
[im 52/97  soft-tissue]
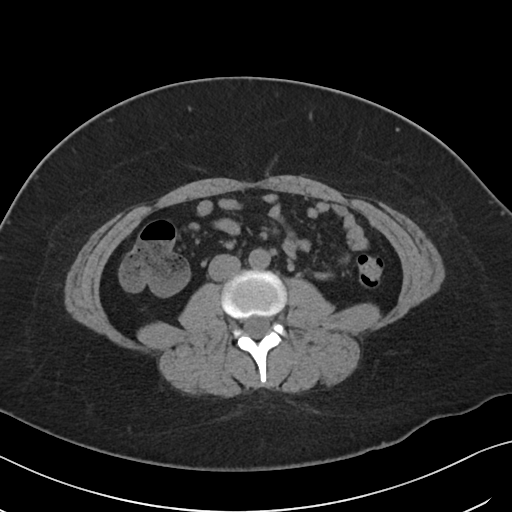
[im 60/97  soft-tissue]
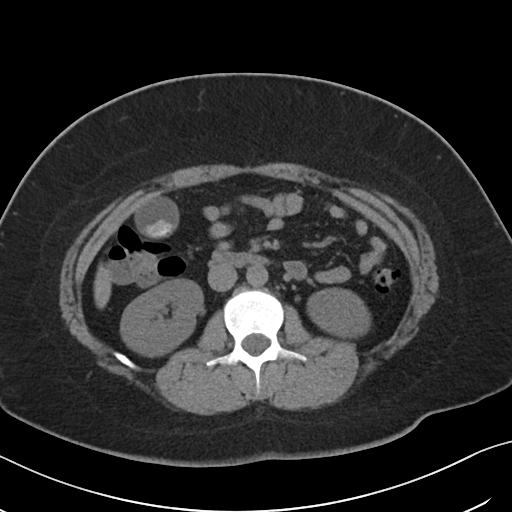
[im 67/97  soft-tissue]
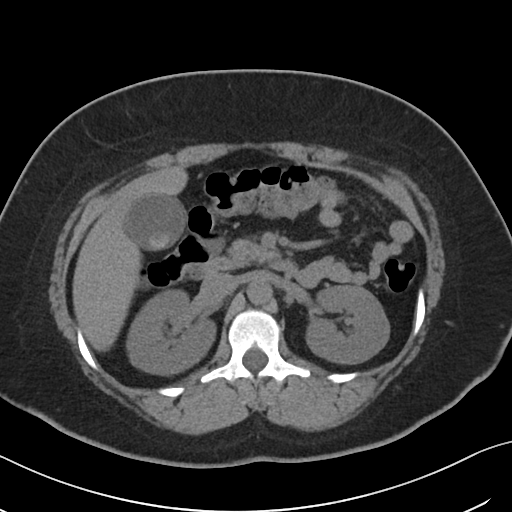
[im 67/97  bone]
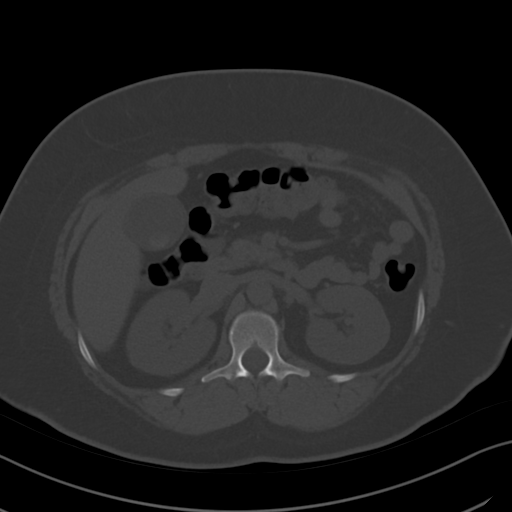
[im 74/97  soft-tissue]
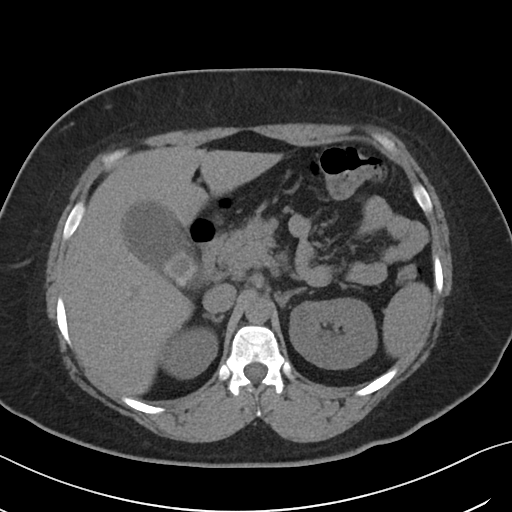
[im 82/97  soft-tissue]
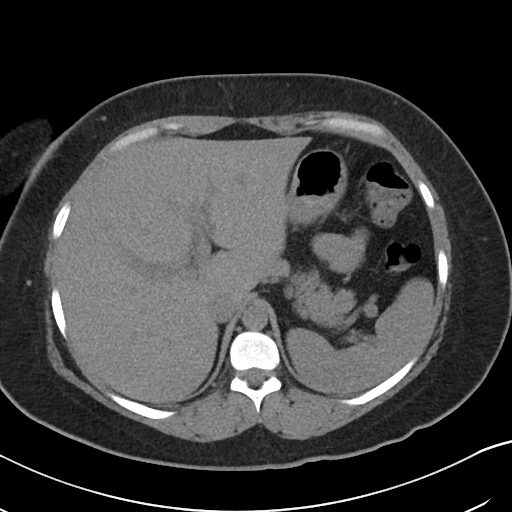
[im 89/97  soft-tissue]
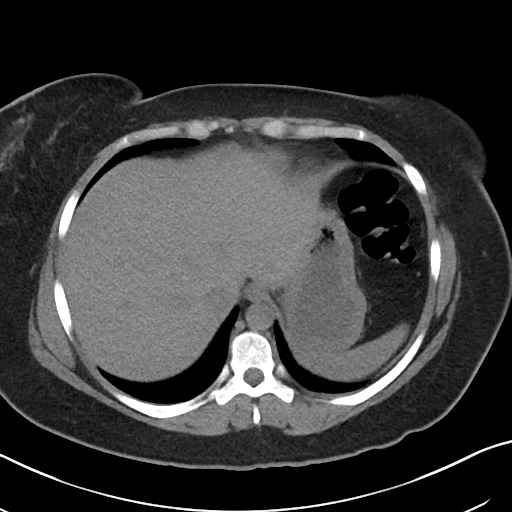

[Series 4: renal stone 3.0 cor · coronal · 0.59mm/px · 3 of 101 slices shown]
[im 34/101  soft-tissue]
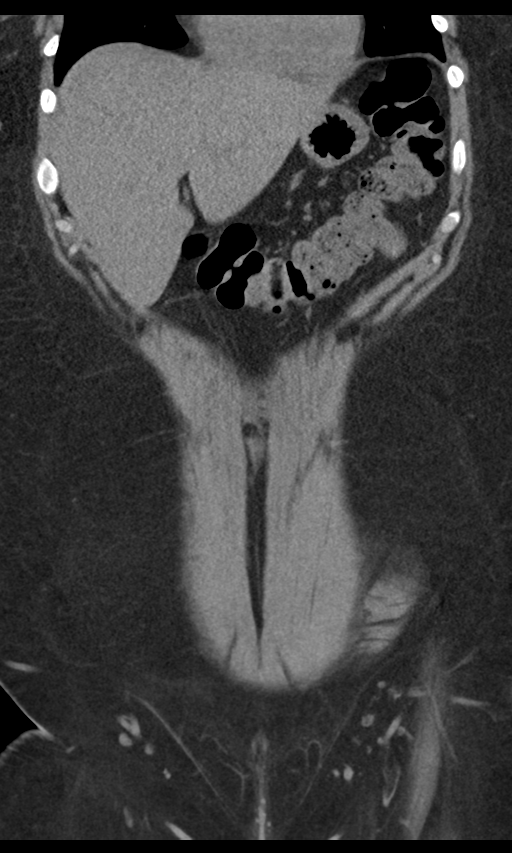
[im 45/101  soft-tissue]
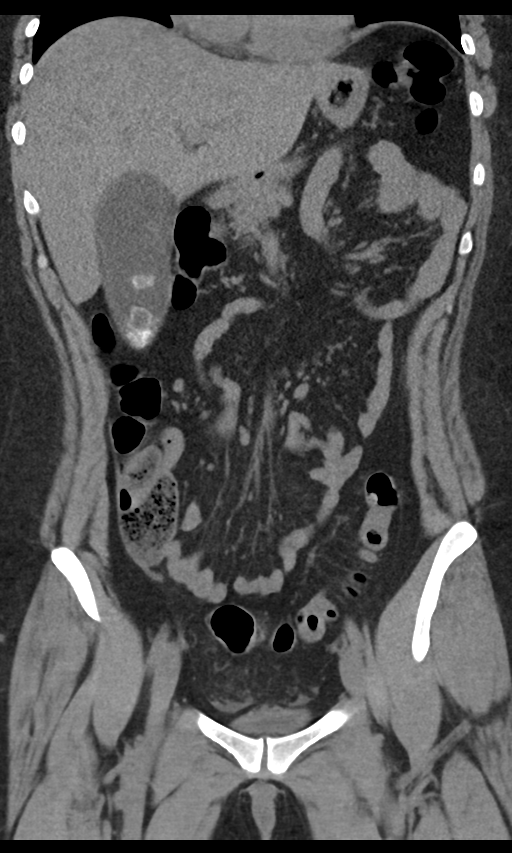
[im 56/101  soft-tissue]
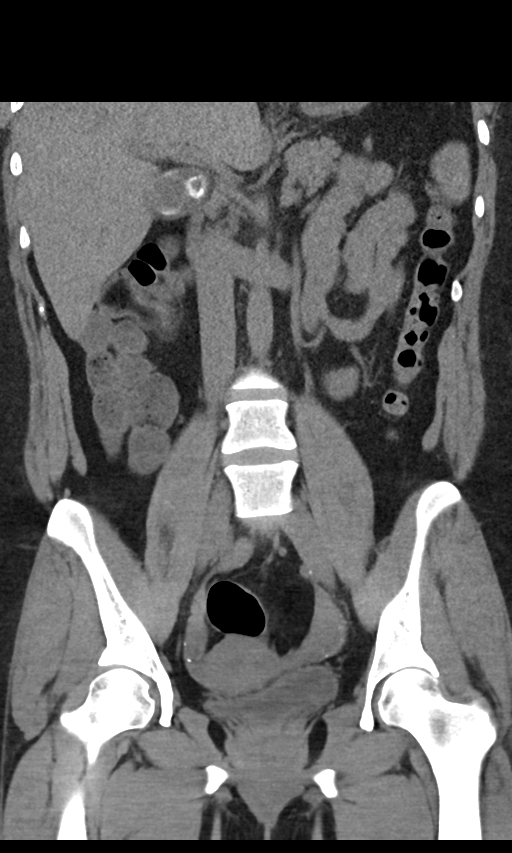

[15 of 46 positions shown; findings below may reference images not displayed]

FINDINGS: Lower chest: Lung bases are clear.

Hepatobiliary: No focal hepatic lesions on noncontrast exam. Several
large gallstones are present ranging in size from 1.5 to 2.5 cm.
Small amount pericholecystic fluid is present. Gallbladder is
distended at 4 cm.

No biliary duct dilatation.  Common bile duct normal.

Pancreas: Very mild peripancreatic fat stranding along the celiac
trunk. Otherwise pancreas is normal.

Spleen: Normal spleen

Adrenals/urinary tract: Adrenal glands and kidneys are normal. The
ureters and bladder normal.

Stomach/Bowel: Stomach, small bowel, appendix, and cecum are normal.
The colon and rectosigmoid colon are normal.

Vascular/Lymphatic: Abdominal aorta is normal caliber. There is no
retroperitoneal or periportal lymphadenopathy. No pelvic
lymphadenopathy.

Reproductive: The several low-density lesions in lower uterine
segment likely representing nabothian cysts. Ovaries are normal.

Other:  Small volume free fluid the pelvis pre

Musculoskeletal: No acute osseous abnormality.
IMPRESSION: 1. The gallbladder is moderately distended with multiple large
gallstones and small amount pericholecystic fluid. Recommend
clinical correlation for ACUTE CHOLECYSTITIS.
2. Very mild retroperitoneal fat stranding along the celiac plexus.
Recommend biochemical correlation for mild pancreatitis.
3. Small amount free fluid the pelvis likely physiologic

## 2018-10-16 ENCOUNTER — Encounter (HOSPITAL_COMMUNITY): Payer: Self-pay | Admitting: Emergency Medicine

## 2018-10-16 ENCOUNTER — Emergency Department (HOSPITAL_COMMUNITY): Payer: Self-pay

## 2018-10-16 ENCOUNTER — Emergency Department (HOSPITAL_COMMUNITY)
Admission: EM | Admit: 2018-10-16 | Discharge: 2018-10-16 | Disposition: A | Discharge status: Home / Self Care | Payer: Self-pay | Attending: Emergency Medicine | Admitting: Emergency Medicine

## 2018-10-16 ENCOUNTER — Other Ambulatory Visit: Payer: Self-pay

## 2018-10-16 DIAGNOSIS — R05 Cough: Secondary | ICD-10-CM | POA: Insufficient documentation

## 2018-10-16 DIAGNOSIS — R5383 Other fatigue: Secondary | ICD-10-CM | POA: Insufficient documentation

## 2018-10-16 DIAGNOSIS — J029 Acute pharyngitis, unspecified: Secondary | ICD-10-CM | POA: Insufficient documentation

## 2018-10-16 DIAGNOSIS — R509 Fever, unspecified: Secondary | ICD-10-CM | POA: Insufficient documentation

## 2018-10-16 DIAGNOSIS — R059 Cough, unspecified: Secondary | ICD-10-CM

## 2018-10-16 LAB — POC URINE PREG, ED: Preg Test, Ur: NEGATIVE

## 2018-10-16 MED ORDER — BENZONATATE 100 MG PO CAPS: 100.0000 mg | ORAL_CAPSULE | Freq: Three times a day (TID) | ORAL | 0 refills | Status: AC | PRN

## 2018-10-16 NOTE — ED Triage Notes (Signed)
Patient arrived from home, reports weakness, low grade fever(100, 99). She states that her co-worker tested positive for COVID-19 and she is concerned that she may have it. Denies shortness of breath, denies aches and pains, denies abdominal pain, nausea, denies vomiting, reports dry cough, denies taking any medication, denies any medical history. EDP at bedside

## 2018-10-16 NOTE — Discharge Instructions (Addendum)
Your chest xray looked clear with no signs of pneumonia or fluid. You can alternate 600 mg of ibuprofen and [phone] mg of Tylenol every 3 hours as needed for pain. Do not exceed 4000 mg of Tylenol daily. Take ibuprofen with food to avoid upset stomach issues. Take tessalon as needed for cough. Drink plenty of fluids and get plenty of rest. QUARANTINE AT HOME UNTIL YOU HAVE HAD SYMPTOMS FOR AT LEAST 7 DAYS AND HAVE NOT HAD ANY FEVER FOR 3 DAYS (WITHOUT THE USE OF IBUPROFEN AND TYLENOL TO BRING DOWN TEMPERATURE).    Return to the ED if any concerning signs or symptoms such as severe shortness of breath, chest pains, persistent vomiting, or passing out.
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
## 2018-10-16 NOTE — ED Notes (Signed)
Patient verbalizes understanding of discharge instructions. Opportunity for questioning and answers were provided. Armband removed by staff, pt discharged from ED.  

## 2020-10-16 IMAGING — CR PORTABLE CHEST - 1 VIEW
1 series · 1 of 1 positions shown · non-contrast
Comparison: None.

CLINICAL DATA: Cough and fever

EXAM:
PORTABLE CHEST 1 VIEW

[AP]
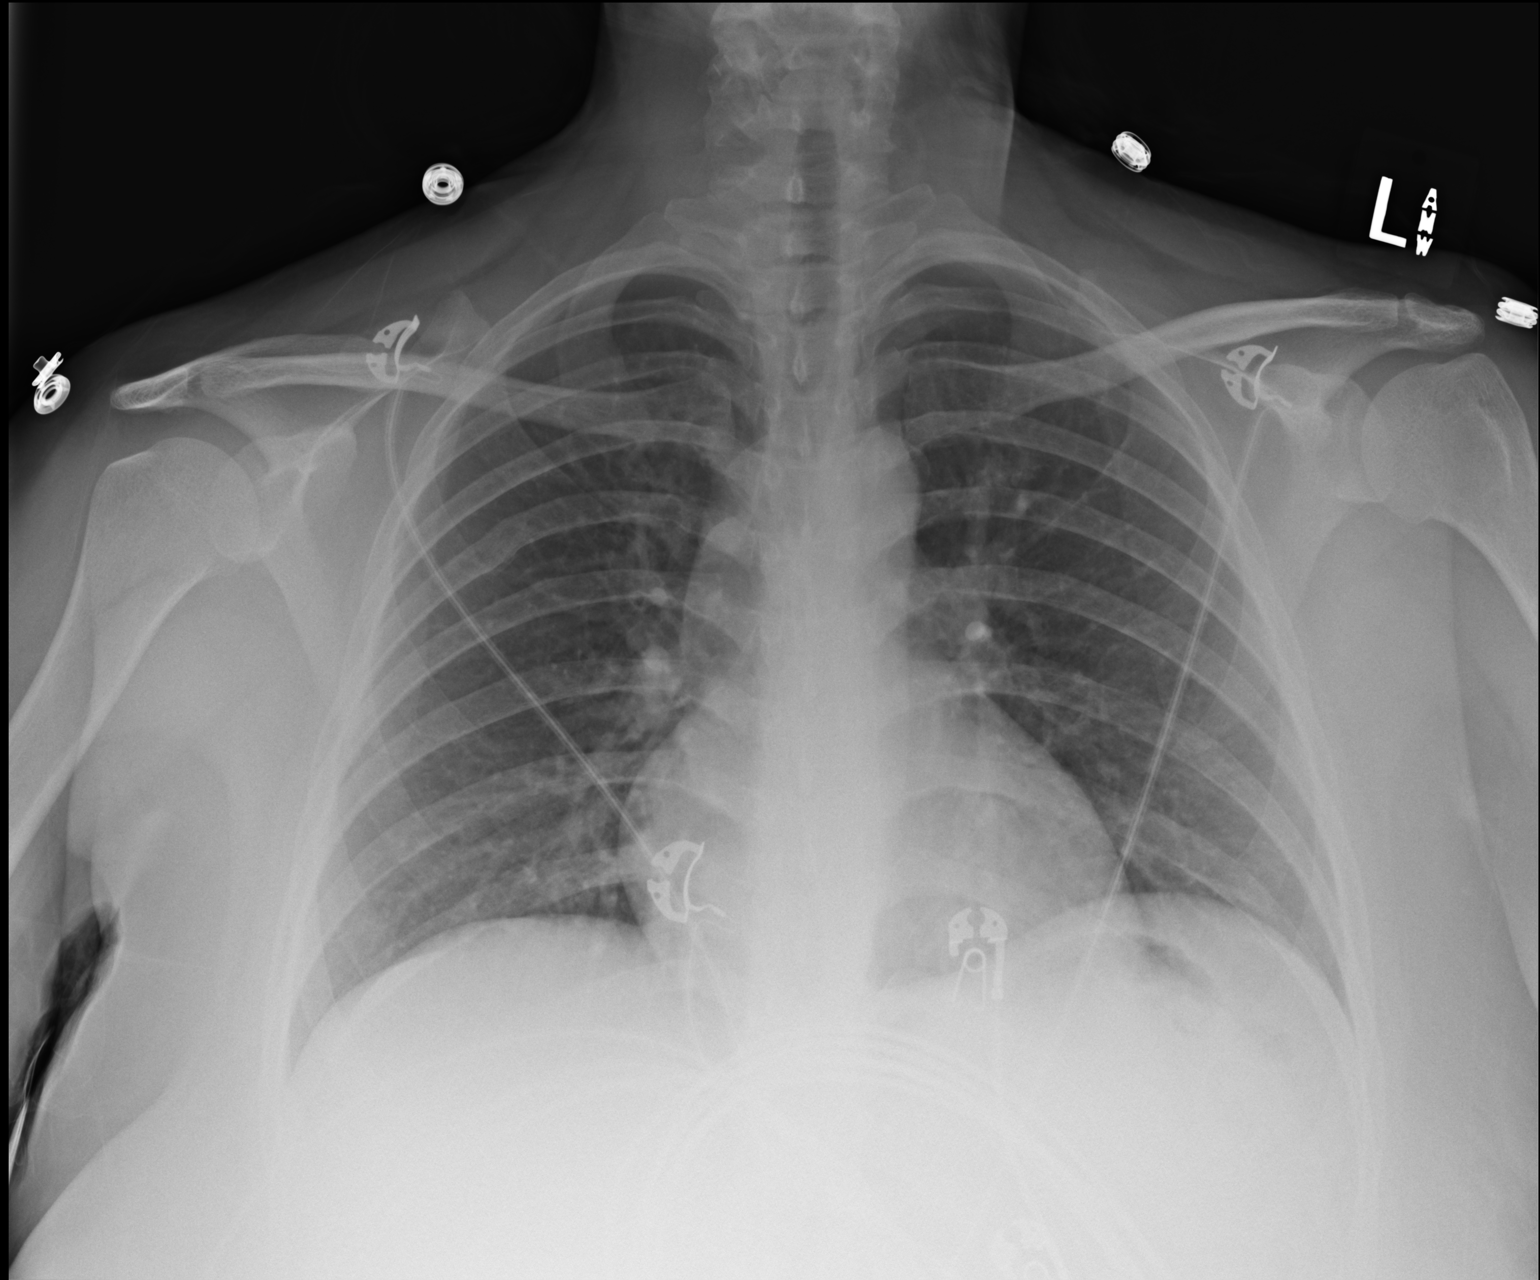

[1 of 1 positions shown; findings below may reference images not displayed]

FINDINGS: Lungs are clear. Heart size and pulmonary vascularity are normal. No
adenopathy. No bone lesions.
IMPRESSION: No edema or consolidation.
